# Patient Record
Sex: Female | Born: 1980 | Race: Black or African American | Hispanic: No | Marital: Single | Smoking: Never smoker
Health system: Southern US, Community
[De-identification: ages and names within clinical notes are randomized; demographics above are authoritative.]

## PROBLEM LIST (undated history)

## (undated) ENCOUNTER — Inpatient Hospital Stay (HOSPITAL_COMMUNITY): Payer: Self-pay

## (undated) DIAGNOSIS — D649 Anemia, unspecified: Secondary | ICD-10-CM

## (undated) DIAGNOSIS — D509 Iron deficiency anemia, unspecified: Secondary | ICD-10-CM

## (undated) DIAGNOSIS — D573 Sickle-cell trait: Secondary | ICD-10-CM

## (undated) DIAGNOSIS — F411 Generalized anxiety disorder: Secondary | ICD-10-CM

## (undated) DIAGNOSIS — F319 Bipolar disorder, unspecified: Secondary | ICD-10-CM

## (undated) DIAGNOSIS — Z789 Other specified health status: Secondary | ICD-10-CM

## (undated) DIAGNOSIS — F329 Major depressive disorder, single episode, unspecified: Secondary | ICD-10-CM

## (undated) DIAGNOSIS — Z862 Personal history of diseases of the blood and blood-forming organs and certain disorders involving the immune mechanism: Secondary | ICD-10-CM

## (undated) HISTORY — PX: INDUCED ABORTION: SHX677

## (undated) HISTORY — PX: OTHER SURGICAL HISTORY: SHX169

## (undated) HISTORY — PX: GANGLION CYST EXCISION: SHX1691

---

## 2004-09-09 ENCOUNTER — Ambulatory Visit (HOSPITAL_BASED_OUTPATIENT_CLINIC_OR_DEPARTMENT_OTHER): Admission: RE | Admit: 2004-09-09 | Discharge: 2004-09-09 | Payer: Self-pay | Admitting: Orthopedic Surgery

## 2004-09-09 ENCOUNTER — Encounter (INDEPENDENT_AMBULATORY_CARE_PROVIDER_SITE_OTHER): Payer: Self-pay | Admitting: *Deleted

## 2004-09-09 ENCOUNTER — Ambulatory Visit (HOSPITAL_COMMUNITY): Admission: RE | Admit: 2004-09-09 | Discharge: 2004-09-09 | Payer: Self-pay | Admitting: Orthopedic Surgery

## 2005-03-12 HISTORY — PX: GANGLION CYST EXCISION: SHX1691

## 2005-03-12 HISTORY — PX: WRIST GANGLION EXCISION: SUR520

## 2006-05-31 ENCOUNTER — Ambulatory Visit (HOSPITAL_BASED_OUTPATIENT_CLINIC_OR_DEPARTMENT_OTHER): Admission: RE | Admit: 2006-05-31 | Discharge: 2006-05-31 | Payer: Self-pay | Admitting: Orthopedic Surgery

## 2006-05-31 ENCOUNTER — Encounter (INDEPENDENT_AMBULATORY_CARE_PROVIDER_SITE_OTHER): Payer: Self-pay | Admitting: *Deleted

## 2006-05-31 HISTORY — PX: WRIST MASS EXCISION: SHX2674

## 2008-04-29 ENCOUNTER — Encounter: Admission: RE | Admit: 2008-04-29 | Discharge: 2008-04-29 | Payer: Self-pay | Admitting: Obstetrics and Gynecology

## 2010-11-11 NOTE — Op Note (Signed)
NAME:  [name], [name] NO.:  [id number]   MEDICAL RECORD NO.:  [id number]          PATIENT TYPE:  AMB   LOCATION:  DSC                          FACILITY:  [facility]   PHYSICIAN:  Katy Fitch. Sypher, M.D. DATE OF BIRTH:  September 17, 1980   DATE OF PROCEDURE:  05/31/2006  DATE OF DISCHARGE:                               OPERATIVE REPORT   PREOPERATIVE DIAGNOSIS:  Recurrent mass dorsal aspect left wrist  following resection of a myxomatous lesion in March 2006.   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   OPERATING SURGEON:  Katy Fitch. Sypher, M.D.   ASSISTANT:  Molly Maduro Dasnoit PA-C.   ANESTHESIA:  General by LMA.   SUPERVISING ANESTHESIOLOGIST:  Dr. Sampson Goon.   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   In March of 2006, she consulted with our practice and requested removal  of what was thought to be a ganglion cyst.   At the time of surgery, she was noted to have a diffuse mass  infiltrating through the dorsal capsule that was characterized as a  myxoma/ganglion.   In my view, it is challenging to discriminate between these two lesions  at times.   The time of her surgery we described to her the infiltrating nature of  her lesion and advised her that there was a significant risk of  recurrence.   She now presents for evaluation of a recurrent mass.  She states that  she was pain-free for more than 1 year.  However, during the past  several months she has noted a thickening on the dorsal aspect of her  wrist and pain with load-bearing and dorsiflexion.   Clinical examination in the office in November 2007 documented a  recurrent mass consistent with a myxoma.   We have advised  re-exploration and excision at this time.   PROCEDURE:  Chrissy Ealey was brought to the operating room and placed  in supine position upon the operating table.   Following the induction of general anesthesia by LMA technique, the left  arm was prepped with Betadine soap and solution and sterilely draped.  A  pneumatic tourniquet was applied to the proximal brachium.  We  administered 1 g of Ancef as an IV prophylactic antibiotic.   The procedure commenced with a careful resection of the previous  surgical scar.  Subcutaneous tissues were meticulously dissected,  sparing the longitudinal veins.  The extensor retinaculum was bulging.  This was split in the line of its fibers and retracted proximally and  distally with Jomarie Longs skin hooks.  A very poorly defined mucinous mass  was identified.  This was bubbly in appearance and extended over a foot  print of about 3 x 3 cm.   This was dissected and curiously had two layers.  There was a  superficial layer of multiloculated cystic material that involved the  entire dorsal capsule.  This was circumferentially dissected and removed  en block.  Beneath this was a second cyst that was blood stained and  measured approximately 1.2 cm diameter.  This was emanating directly up  the scapholunate interosseous ligament.   Care was taken to essentially radically excise all the abnormal-  appearing tissue leaving the proximal pole of the scaphoid exposed and  the scapholunate ligament exposed.   There was noted to be a 8 x 6 mm area of full thickness hyaline  cartilage loss on the proximal pole of the scaphoid which may have been  a pressure phenomenon from the mass.  There was also a small area of  loss of hyaline cartilage on the dorsum of the lunate measuring  approximately 4 mm from proximal to distal and about 6 mm in width.   These areas were thoroughly cleaned and all mucinous material resected.   After hemostasis was achieved, the wound  was irrigated followed by  repair of the skin with subdermal sutures of 4-0 Vicryl and intradermal  3-0 Prolene with Steri-Strips.   A compressive dressing was applied with a gauze bump directly over the  wound to prevent fluid collecting in the dead space.  A compressive  dressing was applied with volar plaster splint maintaining the wrist in  5 degrees dorsiflexion.  There were no apparent complications.   For aftercare, Ms. Shevlin is provided prescription for Percocet 5 mg one  p.o. q.4-6h. p.r.n. pain 20 tablets without refill.  Also, she is  provided Keflex 500 mg one p.o. q.8h. x4 days as prophylactic  antibiotic.      Katy Fitch Sypher, M.D.  Electronically Signed     RVS/MEDQ  D:  05/31/2006  T:  05/31/2006  Job:  [id number]

## 2010-11-11 NOTE — Op Note (Signed)
NAME:  Melody Wells, Melody Wells NO.:  [id number]   MEDICAL RECORD NO.:  [id number]          PATIENT TYPE:  AMB   LOCATION:  DSC                          FACILITY:  [facility]   PHYSICIAN:  Katy Fitch. Sypher Montez Hageman., M.D.DATE OF BIRTH:  09-May-1981   DATE OF PROCEDURE:  09/09/2004  DATE OF DISCHARGE:                                 OPERATIVE REPORT   PREOPERATIVE DIAGNOSIS:  Enlarging mass, dorsal aspect of left wrist,  consistent with large dorsal capsular ganglion.   POSTOPERATIVE DIAGNOSIS:  Enlarging mass, dorsal aspect of left wrist,  consistent with large dorsal capsular ganglion with identification of  complex extra-articular ganglion and multiple budding intra-articular  ganglion cysts directly over scapholunate interosseous ligament within wrist  capsule.   OPERATION:  Aggressive debridement of ganglion cyst/myxoma, dorsal aspect of  left wrist, with repair of dorsal wrist capsule.   OPERATING SURGEON:  Katy Fitch. Sypher, M.D.   ASSISTANT:  Jonni Sanger, P.A.   ANESTHESIA:  General by LMA   SUPERVISING ANESTHESIOLOGIST:  Janetta Hora. Gelene Mink, M.D.   INDICATIONS:  Melody Wells is a 30 year old right-hand dominant woman  referred by Dr. Cherlyn Roberts for evaluation and management of an  enlarging right dorsal wrist mass.  She had had a ganglion for many years  which was now unsightly, quite large and uncomfortable.  She requested  excision.   Preoperatively, she was clearly advised that the ganglion phenomenon this  poorly understood.   We reviewed the art of removing ganglions and explained to her that there  was a significant chance of recurrence of the next 10 years.   We will do a best to debride the mucoid collection as well as the abnormal  tissues near the scapholunate ligament that typically are related to the  formation of the lesions.   Preoperatively, questions were invited and answered.  After informed consent  with Dr. Gelene Mink regarding  anesthesia she has selected general anesthesia  by LMA technique.   PROCEDURE:  Jazel Nimmons was brought to the operating room and placed in a  supine position upon the operating table.   Following the induction of general anesthesia by LMA technique, the left arm  was prepped with Betadine soap and solution and sterilely draped.   Following exsanguination of the left arm an Esmarch bandage, an arterial  tourniquet on the proximal brachium was inflated to 220 mmHg.   One gram of Ancef was administered as an IV prophylactic antibiotic.   Procedure commenced with a 3-cm transverse incision directly over the dorsal  aspect of the cyst.  Subcutaneous tissues were carefully divided, taking  care to identify the extensors in the second and fourth dorsal compartments.  The cyst was more than 3 x 3 cm and approximately 2.5 cm in height.  The  cyst wall was carefully dissected free from the capsular structures and care  was taken to preserve the dorsal carpal arch.   The cyst was drained of its contents and followed to the capsule directly  over the scapholunate ligament.   All portion of the extra-articular cyst was removed.  Subcutaneous region  was thoroughly irrigated.  The joint was palpated and a clear mass within  the dorsal capsule of the wrist was noted.  An arthrotomy was performed,  exposing the midcarpal joint, scapholunate interosseous ligament, scaphoid  and lunate.  There were too small ganglion measuring approximately 5 x 6 mm  on the dorsoulnar aspect of the scapholunate interosseous ligament.   These were debrided with a rongeur.  Care was taken to remove all pathologic-  appearing tissues on the dorsal aspect the scapholunate ligament without  disrupting the fibers of the ligament.   There was scalloping on the dorsal aspect of the proximal scaphoid and  dorsal lunate.  This cyst appeared to be present for quite awhile.   The wrist and dorsal capsule region were  thoroughly irrigated with sterile  saline to remove all mucinous material with a syringe for high-pressure  irrigation.   The capsule was then repaired with a running suture of 4-0 Vicryl to create  a watertight seal.   The wound was then repaired with intradermal 3-0 Prolene.   A compressive dressing was applied with a volar plaster splint  to maintain  the wrist in 5 degrees of dorsiflexion.   There were no apparent complications.      RVS/MEDQ  D:  09/09/2004  T:  09/09/2004  Job:  [id number]

## 2012-09-02 ENCOUNTER — Other Ambulatory Visit: Payer: Self-pay | Admitting: Family Medicine

## 2012-09-02 ENCOUNTER — Other Ambulatory Visit (HOSPITAL_COMMUNITY)
Admission: RE | Admit: 2012-09-02 | Discharge: 2012-09-02 | Disposition: A | Discharge status: Home / Self Care | Payer: BC Managed Care – PPO | Source: Ambulatory Visit | Attending: Family Medicine | Admitting: Family Medicine

## 2012-09-02 DIAGNOSIS — Z113 Encounter for screening for infections with a predominantly sexual mode of transmission: Secondary | ICD-10-CM | POA: Insufficient documentation

## 2012-09-02 DIAGNOSIS — Z01419 Encounter for gynecological examination (general) (routine) without abnormal findings: Secondary | ICD-10-CM | POA: Insufficient documentation

## 2013-10-29 LAB — OB RESULTS CONSOLE ABO/RH: RH Type: POSITIVE

## 2013-10-29 LAB — OB RESULTS CONSOLE HEPATITIS B SURFACE ANTIGEN: Hepatitis B Surface Ag: NEGATIVE

## 2013-10-29 LAB — OB RESULTS CONSOLE HIV ANTIBODY (ROUTINE TESTING): HIV: NONREACTIVE

## 2013-10-29 LAB — OB RESULTS CONSOLE ANTIBODY SCREEN: ANTIBODY SCREEN: NEGATIVE

## 2013-10-29 LAB — OB RESULTS CONSOLE RPR: RPR: NONREACTIVE

## 2013-10-29 LAB — OB RESULTS CONSOLE RUBELLA ANTIBODY, IGM: RUBELLA: IMMUNE

## 2013-11-20 LAB — OB RESULTS CONSOLE GC/CHLAMYDIA
Chlamydia: NEGATIVE
GC PROBE AMP, GENITAL: NEGATIVE

## 2013-11-28 ENCOUNTER — Emergency Department (HOSPITAL_COMMUNITY)
Admission: EM | Admit: 2013-11-28 | Discharge: 2013-11-28 | Disposition: A | Discharge status: Home / Self Care | Payer: BC Managed Care – PPO | Attending: Emergency Medicine | Admitting: Emergency Medicine

## 2013-11-28 ENCOUNTER — Other Ambulatory Visit: Payer: Self-pay

## 2013-11-28 ENCOUNTER — Encounter (HOSPITAL_COMMUNITY): Payer: Self-pay | Admitting: Emergency Medicine

## 2013-11-28 DIAGNOSIS — M549 Dorsalgia, unspecified: Secondary | ICD-10-CM

## 2013-11-28 DIAGNOSIS — Z792 Long term (current) use of antibiotics: Secondary | ICD-10-CM | POA: Insufficient documentation

## 2013-11-28 DIAGNOSIS — Z79899 Other long term (current) drug therapy: Secondary | ICD-10-CM | POA: Insufficient documentation

## 2013-11-28 DIAGNOSIS — Z3490 Encounter for supervision of normal pregnancy, unspecified, unspecified trimester: Secondary | ICD-10-CM

## 2013-11-28 DIAGNOSIS — O9989 Other specified diseases and conditions complicating pregnancy, childbirth and the puerperium: Secondary | ICD-10-CM | POA: Insufficient documentation

## 2013-11-28 DIAGNOSIS — Y929 Unspecified place or not applicable: Secondary | ICD-10-CM | POA: Insufficient documentation

## 2013-11-28 DIAGNOSIS — IMO0002 Reserved for concepts with insufficient information to code with codable children: Secondary | ICD-10-CM | POA: Insufficient documentation

## 2013-11-28 DIAGNOSIS — Y9389 Activity, other specified: Secondary | ICD-10-CM | POA: Insufficient documentation

## 2013-11-28 DIAGNOSIS — R110 Nausea: Secondary | ICD-10-CM | POA: Insufficient documentation

## 2013-11-28 LAB — URINALYSIS, ROUTINE W REFLEX MICROSCOPIC
Bilirubin Urine: NEGATIVE
GLUCOSE, UA: NEGATIVE mg/dL
Hgb urine dipstick: NEGATIVE
Ketones, ur: NEGATIVE mg/dL
Leukocytes, UA: NEGATIVE
NITRITE: NEGATIVE
PH: 6.5 (ref 5.0–8.0)
Protein, ur: NEGATIVE mg/dL
Specific Gravity, Urine: 1.018 (ref 1.005–1.030)
UROBILINOGEN UA: 0.2 mg/dL (ref 0.0–1.0)

## 2013-11-28 MED ORDER — ACETAMINOPHEN 325 MG PO TABS: 650.0000 mg | ORAL_TABLET | Freq: Once | ORAL | Status: DC
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
## 2014-04-15 ENCOUNTER — Inpatient Hospital Stay (HOSPITAL_COMMUNITY): Payer: BC Managed Care – PPO

## 2014-04-15 ENCOUNTER — Encounter (HOSPITAL_COMMUNITY): Payer: Self-pay | Admitting: *Deleted

## 2014-04-15 ENCOUNTER — Inpatient Hospital Stay (HOSPITAL_COMMUNITY)
Admission: AD | Admit: 2014-04-15 | Discharge: 2014-04-15 | Disposition: A | Discharge status: Home / Self Care | Payer: BC Managed Care – PPO | Source: Ambulatory Visit | Attending: Obstetrics and Gynecology | Admitting: Obstetrics and Gynecology

## 2014-04-15 DIAGNOSIS — Z3A32 32 weeks gestation of pregnancy: Secondary | ICD-10-CM

## 2014-04-15 DIAGNOSIS — O368130 Decreased fetal movements, third trimester, not applicable or unspecified: Secondary | ICD-10-CM | POA: Diagnosis not present

## 2014-04-15 DIAGNOSIS — O368134 Decreased fetal movements, third trimester, fetus 4: Secondary | ICD-10-CM

## 2014-04-15 HISTORY — DX: Other specified health status: Z78.9

## 2014-04-15 HISTORY — DX: Anemia, unspecified: D64.9

## 2014-04-15 HISTORY — DX: Sickle-cell trait: D57.3

## 2014-04-15 NOTE — MAU Note (Signed)
Audible movement noted while in with pt, pt agrees- baby is moving

## 2014-04-15 NOTE — Discharge Instructions (Signed)
Keep appointment at office for Monday.  Fetal Movement Counts Patient Name: __________________________________________________ Patient Due Date: ____________________ Performing a fetal movement count is highly recommended in high-risk pregnancies, but it is good for every pregnant woman to do. Your health care provider may ask you to start counting fetal movements at 28 weeks of the pregnancy. Fetal movements often increase:  After eating a full meal.  After physical activity.  After eating or drinking something sweet or cold.  At rest. Pay attention to when you feel the baby is most active. This will help you notice a pattern of your baby's sleep and wake cycles and what factors contribute to an increase in fetal movement. It is important to perform a fetal movement count at the same time each day when your baby is normally most active.  HOW TO COUNT FETAL MOVEMENTS 1. Find a quiet and comfortable area to sit or lie down on your left side. Lying on your left side provides the best blood and oxygen circulation to your baby. 2. Write down the day and time on a sheet of paper or in a journal. 3. Start counting kicks, flutters, swishes, rolls, or jabs in a 2-hour period. You should feel at least 10 movements within 2 hours. 4. If you do not feel 10 movements in 2 hours, wait 2-3 hours and count again. Look for a change in the pattern or not enough counts in 2 hours. SEEK MEDICAL CARE IF:  You feel less than 10 counts in 2 hours, tried twice.  There is no movement in over an hour.  The pattern is changing or taking longer each day to reach 10 counts in 2 hours.  You feel the baby is not moving as he or she usually does. Date: ____________ Movements: ____________ Start time: ____________ Doreatha Martin time: ____________  Date: ____________ Movements: ____________ Start time: ____________ Doreatha Martin time: ____________ Date: ____________ Movements: ____________ Start time: ____________ Doreatha Martin time:  ____________ Date: ____________ Movements: ____________ Start time: ____________ Doreatha Martin time: ____________ Date: ____________ Movements: ____________ Start time: ____________ Doreatha Martin time: ____________ Date: ____________ Movements: ____________ Start time: ____________ Doreatha Martin time: ____________ Date: ____________ Movements: ____________ Start time: ____________ Doreatha Martin time: ____________ Date: ____________ Movements: ____________ Start time: ____________ Doreatha Martin time: ____________  Date: ____________ Movements: ____________ Start time: ____________ Doreatha Martin time: ____________ Date: ____________ Movements: ____________ Start time: ____________ Doreatha Martin time: ____________ Date: ____________ Movements: ____________ Start time: ____________ Doreatha Martin time: ____________ Date: ____________ Movements: ____________ Start time: ____________ Doreatha Martin time: ____________ Date: ____________ Movements: ____________ Start time: ____________ Doreatha Martin time: ____________ Date: ____________ Movements: ____________ Start time: ____________ Doreatha Martin time: ____________ Date: ____________ Movements: ____________ Start time: ____________ Doreatha Martin time: ____________  Date: ____________ Movements: ____________ Start time: ____________ Doreatha Martin time: ____________ Date: ____________ Movements: ____________ Start time: ____________ Doreatha Martin time: ____________ Date: ____________ Movements: ____________ Start time: ____________ Doreatha Martin time: ____________ Date: ____________ Movements: ____________ Start time: ____________ Doreatha Martin time: ____________ Date: ____________ Movements: ____________ Start time: ____________ Doreatha Martin time: ____________ Date: ____________ Movements: ____________ Start time: ____________ Doreatha Martin time: ____________ Date: ____________ Movements: ____________ Start time: ____________ Doreatha Martin time: ____________  Date: ____________ Movements: ____________ Start time: ____________ Doreatha Martin time: ____________ Date: ____________ Movements:  ____________ Start time: ____________ Doreatha Martin time: ____________ Date: ____________ Movements: ____________ Start time: ____________ Doreatha Martin time: ____________ Date: ____________ Movements: ____________ Start time: ____________ Doreatha Martin time: ____________ Date: ____________ Movements: ____________ Start time: ____________ Doreatha Martin time: ____________ Date: ____________ Movements: ____________ Start time: ____________ Doreatha Martin time: ____________ Date: ____________ Movements: ____________ Start time: ____________ Doreatha Martin time: ____________  Date:  ____________ Movements: ____________ Start time: ____________ Doreatha MartinFinish time: ____________ Date: ____________ Movements: ____________ Start time: ____________ Doreatha MartinFinish time: ____________ Date: ____________ Movements: ____________ Start time: ____________ Doreatha MartinFinish time: ____________ Date: ____________ Movements: ____________ Start time: ____________ Doreatha MartinFinish time: ____________ Date: ____________ Movements: ____________ Start time: ____________ Doreatha MartinFinish time: ____________ Date: ____________ Movements: ____________ Start time: ____________ Doreatha MartinFinish time: ____________ Date: ____________ Movements: ____________ Start time: ____________ Doreatha MartinFinish time: ____________  Date: ____________ Movements: ____________ Start time: ____________ Doreatha MartinFinish time: ____________ Date: ____________ Movements: ____________ Start time: ____________ Doreatha MartinFinish time: ____________ Date: ____________ Movements: ____________ Start time: ____________ Doreatha MartinFinish time: ____________ Date: ____________ Movements: ____________ Start time: ____________ Doreatha MartinFinish time: ____________ Date: ____________ Movements: ____________ Start time: ____________ Doreatha MartinFinish time: ____________ Date: ____________ Movements: ____________ Start time: ____________ Doreatha MartinFinish time: ____________ Date: ____________ Movements: ____________ Start time: ____________ Doreatha MartinFinish time: ____________  Date: ____________ Movements: ____________ Start time: ____________ Doreatha MartinFinish  time: ____________ Date: ____________ Movements: ____________ Start time: ____________ Doreatha MartinFinish time: ____________ Date: ____________ Movements: ____________ Start time: ____________ Doreatha MartinFinish time: ____________ Date: ____________ Movements: ____________ Start time: ____________ Doreatha MartinFinish time: ____________ Date: ____________ Movements: ____________ Start time: ____________ Doreatha MartinFinish time: ____________ Date: ____________ Movements: ____________ Start time: ____________ Doreatha MartinFinish time: ____________ Date: ____________ Movements: ____________ Start time: ____________ Doreatha MartinFinish time: ____________  Date: ____________ Movements: ____________ Start time: ____________ Doreatha MartinFinish time: ____________ Date: ____________ Movements: ____________ Start time: ____________ Doreatha MartinFinish time: ____________ Date: ____________ Movements: ____________ Start time: ____________ Doreatha MartinFinish time: ____________ Date: ____________ Movements: ____________ Start time: ____________ Doreatha MartinFinish time: ____________ Date: ____________ Movements: ____________ Start time: ____________ Doreatha MartinFinish time: ____________ Date: ____________ Movements: ____________ Start time: ____________ Doreatha MartinFinish time: ____________ Document Released: 07/12/2006 Document Revised: 10/27/2013 Document Reviewed: 04/08/2012 ExitCare Patient Information 2015 [employer]. This information is not intended to replace advice given to you by your health care provider. Make sure you discuss any questions you have with your health care provider. Braxton Hicks Contractions Contractions of the uterus can occur throughout pregnancy. Contractions are not always a sign that you are in labor.  WHAT ARE BRAXTON HICKS CONTRACTIONS?  Contractions that occur before labor are called Braxton Hicks contractions, or false labor. Toward the end of pregnancy (32-34 weeks), these contractions can develop more often and may become more forceful. This is not true labor because these contractions do not result in opening  (dilatation) and thinning of the cervix. They are sometimes difficult to tell apart from true labor because these contractions can be forceful and people have different pain tolerances. You should not feel embarrassed if you go to the hospital with false labor. Sometimes, the only way to tell if you are in true labor is for your health care provider to look for changes in the cervix. If there are no prenatal problems or other health problems associated with the pregnancy, it is completely safe to be sent home with false labor and await the onset of true labor. HOW CAN YOU TELL THE DIFFERENCE BETWEEN TRUE AND FALSE LABOR? False Labor  The contractions of false labor are usually shorter and not as hard as those of true labor.   The contractions are usually irregular.   The contractions are often felt in the front of the lower abdomen and in the groin.   The contractions may go away when you walk around or change positions while lying down.   The contractions get weaker and are shorter lasting as time goes on.   The contractions do not usually become progressively stronger, regular, and closer together as with true labor.  True Labor  Contractions  in true labor last 30-70 seconds, become very regular, usually become more intense, and increase in frequency.   The contractions do not go away with walking.   The discomfort is usually felt in the top of the uterus and spreads to the lower abdomen and low back.   True labor can be determined by your health care provider with an exam. This will show that the cervix is dilating and getting thinner.  WHAT TO REMEMBER  Keep up with your usual exercises and follow other instructions given by your health care provider.   Take medicines as directed by your health care provider.   Keep your regular prenatal appointments.   Eat and drink lightly if you think you are going into labor.   If Braxton Hicks contractions are making you  uncomfortable:   Change your position from lying down or resting to walking, or from walking to resting.   Sit and rest in a tub of warm water.   Drink 2-3 glasses of water. Dehydration may cause these contractions.   Do slow and deep breathing several times an hour.  WHEN SHOULD I SEEK IMMEDIATE MEDICAL CARE? Seek immediate medical care if:  Your contractions become stronger, more regular, and closer together.   You have fluid leaking or gushing from your vagina.   You have a fever.   You pass blood-tinged mucus.   You have vaginal bleeding.   You have continuous abdominal pain.   You have low back pain that you never had before.   You feel your baby's head pushing down and causing pelvic pressure.   Your baby is not moving as much as it used to.  Document Released: 06/12/2005 Document Revised: 06/17/2013 Document Reviewed: 03/24/2013 [facility]Exit[facility] Patient Information 2015 [place], [place]. This information is not intended to replace advice given to you by your health care provider. Make sure you discuss any questions you have with your health care provider.

## 2014-04-15 NOTE — MAU Provider Note (Signed)
History   NR NST ofice  Chief Complaint  Patient presents with  . Decreased Fetal Movement   33 yo G2P0010 BF @ 32 6/[redacted] weeks gestation presented to office with complaint of decreased FM. Last ate at noon NST done there  Was reassuring but non reactive. Pt sent for further evaluation   OB History   Grav Para Term Preterm Abortions TAB SAB Ect Mult Living   2    1 1  0         Past Medical History  Diagnosis Date  . Medical history non-contributory   . Anemia   . Sickle cell trait     Past Surgical History  Procedure Laterality Date  . Cyst removal on left wrist    . Induced abortion    . Ganglion cyst excision      x2, left wrist    Family History  Problem Relation Age of Onset  . Hearing loss Neg Hx     History  Substance Use Topics  . Smoking status: Never Smoker   . Smokeless tobacco: Never Used  . Alcohol Use: No    Allergies: No Known Allergies  Prescriptions prior to admission  Medication Sig Dispense Refill  . metroNIDAZOLE (FLAGYL) 500 MG tablet Take 500 mg by mouth 2 (two) times daily. Started medication today take for 7 days until completed      . Prenatal Vit-Fe Fumarate-FA (PRENATAL MULTIVITAMIN) TABS tablet Take 1 tablet by mouth daily at 12 noon.         Physical Exam   Blood pressure 132/75, pulse 90, temperature 97.6 F (36.4 C), temperature source Oral, resp. rate 18, height 5\' 4"  (1.626 m), weight 95.709 kg (211 lb).  No exam performed today, just done in office. ED Course   Decreased FM in  Third trimester IUP @ 32 6 /7 weeks P) NST. BPP MDM   Herbert Marken A, MD 7:30 PM 04/15/2014

## 2014-04-15 NOTE — MAU Note (Signed)
Decreased fetal movement last couple days. No bleeding, leaking or pain.  Failed NST at office.  Sent over for further eval

## 2014-04-27 ENCOUNTER — Encounter (HOSPITAL_COMMUNITY): Payer: Self-pay | Admitting: *Deleted

## 2014-05-04 LAB — OB RESULTS CONSOLE GBS: GBS: POSITIVE

## 2014-05-23 ENCOUNTER — Inpatient Hospital Stay (HOSPITAL_COMMUNITY): Payer: BC Managed Care – PPO | Admitting: Anesthesiology

## 2014-05-23 ENCOUNTER — Encounter (HOSPITAL_COMMUNITY): Payer: Self-pay | Admitting: *Deleted

## 2014-05-23 ENCOUNTER — Inpatient Hospital Stay (HOSPITAL_COMMUNITY)
Admission: AD | Admit: 2014-05-23 | Discharge: 2014-05-26 | DRG: 765 | Disposition: A | Discharge status: Home / Self Care | Payer: BC Managed Care – PPO | Source: Ambulatory Visit | Attending: Obstetrics & Gynecology | Admitting: Obstetrics & Gynecology

## 2014-05-23 ENCOUNTER — Encounter (HOSPITAL_COMMUNITY)
Admission: AD | Disposition: A | Discharge status: Home / Self Care | Payer: Self-pay | Source: Ambulatory Visit | Attending: Obstetrics & Gynecology

## 2014-05-23 DIAGNOSIS — O4403 Placenta previa specified as without hemorrhage, third trimester: Secondary | ICD-10-CM | POA: Diagnosis present

## 2014-05-23 DIAGNOSIS — IMO0001 Reserved for inherently not codable concepts without codable children: Secondary | ICD-10-CM

## 2014-05-23 DIAGNOSIS — Z3483 Encounter for supervision of other normal pregnancy, third trimester: Secondary | ICD-10-CM | POA: Diagnosis present

## 2014-05-23 DIAGNOSIS — D573 Sickle-cell trait: Secondary | ICD-10-CM | POA: Diagnosis present

## 2014-05-23 DIAGNOSIS — Z3A38 38 weeks gestation of pregnancy: Secondary | ICD-10-CM | POA: Diagnosis present

## 2014-05-23 DIAGNOSIS — O99824 Streptococcus B carrier state complicating childbirth: Secondary | ICD-10-CM | POA: Diagnosis present

## 2014-05-23 DIAGNOSIS — Z9889 Other specified postprocedural states: Secondary | ICD-10-CM

## 2014-05-23 DIAGNOSIS — O9902 Anemia complicating childbirth: Secondary | ICD-10-CM | POA: Diagnosis present

## 2014-05-23 LAB — CBC
HCT: 38.2 % (ref 36.0–46.0)
Hemoglobin: 13.5 g/dL (ref 12.0–15.0)
MCH: 28.2 pg (ref 26.0–34.0)
MCHC: 35.3 g/dL (ref 30.0–36.0)
MCV: 79.9 fL (ref 78.0–100.0)
PLATELETS: 204 10*3/uL (ref 150–400)
RBC: 4.78 MIL/uL (ref 3.87–5.11)
RDW: 13.9 % (ref 11.5–15.5)
WBC: 10.4 10*3/uL (ref 4.0–10.5)

## 2014-05-23 LAB — RAPID HIV SCREEN (WH-MAU): Rapid HIV Screen: NONREACTIVE

## 2014-05-23 LAB — RPR

## 2014-05-23 SURGERY — Surgical Case
Anesthesia: Epidural | Site: Abdomen

## 2014-05-23 MED ORDER — ONDANSETRON HCL 4 MG PO TABS: 4.0000 mg | ORAL_TABLET | ORAL | Status: DC | PRN

## 2014-05-23 MED ORDER — OXYCODONE-ACETAMINOPHEN 5-325 MG PO TABS: 2.0000 | ORAL_TABLET | ORAL | Status: DC | PRN

## 2014-05-23 MED ORDER — ONDANSETRON HCL 4 MG/2ML IJ SOLN: 4.0000 mg | INTRAMUSCULAR | Status: DC | PRN

## 2014-05-23 MED ORDER — MEPERIDINE HCL 25 MG/ML IJ SOLN: 6.2500 mg | INTRAMUSCULAR | Status: DC | PRN

## 2014-05-23 MED ORDER — OXYTOCIN 40 UNITS IN LACTATED RINGERS INFUSION - SIMPLE MED: 62.5000 mL/h | INTRAVENOUS | Status: DC

## 2014-05-23 MED ORDER — NALBUPHINE HCL 10 MG/ML IJ SOLN: 5.0000 mg | Freq: Once | INTRAMUSCULAR | Status: AC | PRN

## 2014-05-23 MED ORDER — SODIUM CHLORIDE 0.9 % IJ SOLN: 3.0000 mL | INTRAMUSCULAR | Status: DC | PRN

## 2014-05-23 MED ORDER — CITRIC ACID-SODIUM CITRATE 334-500 MG/5ML PO SOLN
30.0000 mL | ORAL | Status: DC | PRN
Start: 2014-05-23 — End: 2014-05-23
  Administered 2014-05-23: 30 mL via ORAL
  Filled 2014-05-23: qty 15

## 2014-05-23 MED ORDER — OXYCODONE-ACETAMINOPHEN 5-325 MG PO TABS: 1.0000 | ORAL_TABLET | ORAL | Status: DC | PRN

## 2014-05-23 MED ORDER — FENTANYL CITRATE 0.05 MG/ML IJ SOLN
INTRAMUSCULAR | Status: AC
  Filled 2014-05-23: qty 2

## 2014-05-23 MED ORDER — FENTANYL 2.5 MCG/ML BUPIVACAINE 1/10 % EPIDURAL INFUSION (WH - ANES)
INTRAMUSCULAR | Status: AC
  Administered 2014-05-23: 10 mL/h via EPIDURAL
  Filled 2014-05-23: qty 125

## 2014-05-23 MED ORDER — INFLUENZA VAC SPLIT QUAD 0.5 ML IM SUSY: 0.5000 mL | PREFILLED_SYRINGE | INTRAMUSCULAR | Status: DC

## 2014-05-23 MED ORDER — PHENYLEPHRINE 40 MCG/ML (10ML) SYRINGE FOR IV PUSH (FOR BLOOD PRESSURE SUPPORT)
PREFILLED_SYRINGE | INTRAVENOUS | Status: DC
Start: 2014-05-23 — End: 2014-05-23
  Filled 2014-05-23: qty 10

## 2014-05-23 MED ORDER — ONDANSETRON HCL 4 MG/2ML IJ SOLN
INTRAMUSCULAR | Status: AC
  Filled 2014-05-23: qty 2

## 2014-05-23 MED ORDER — EPHEDRINE 5 MG/ML INJ
INTRAVENOUS | Status: AC
  Filled 2014-05-23: qty 4

## 2014-05-23 MED ORDER — PENICILLIN G POTASSIUM 5000000 UNITS IJ SOLR
2.5000 10*6.[IU] | INTRAVENOUS | Status: DC
  Filled 2014-05-23 (×3): qty 2.5

## 2014-05-23 MED ORDER — CEFAZOLIN SODIUM-DEXTROSE 2-3 GM-% IV SOLR
INTRAVENOUS | Status: AC
  Filled 2014-05-23: qty 50

## 2014-05-23 MED ORDER — OXYTOCIN BOLUS FROM INFUSION
500.0000 mL | INTRAVENOUS | Status: DC
Start: 2014-05-23 — End: 2014-05-23

## 2014-05-23 MED ORDER — LACTATED RINGERS IV SOLN
INTRAVENOUS | Status: DC | PRN
  Administered 2014-05-23: 06:00:00 via INTRAVENOUS

## 2014-05-23 MED ORDER — DIPHENHYDRAMINE HCL 50 MG/ML IJ SOLN
12.5000 mg | INTRAMUSCULAR | Status: DC | PRN
  Administered 2014-05-23: 12.5 mg via INTRAVENOUS
  Filled 2014-05-23: qty 1

## 2014-05-23 MED ORDER — BUPIVACAINE HCL (PF) 0.25 % IJ SOLN
INTRAMUSCULAR | Status: AC
  Filled 2014-05-23: qty 10

## 2014-05-23 MED ORDER — FENTANYL CITRATE 0.05 MG/ML IJ SOLN
INTRAMUSCULAR | Status: DC | PRN
  Administered 2014-05-23: 100 ug via INTRAVENOUS

## 2014-05-23 MED ORDER — LIDOCAINE HCL (PF) 1 % IJ SOLN
INTRAMUSCULAR | Status: DC | PRN
  Administered 2014-05-23 (×2): 4 mL

## 2014-05-23 MED ORDER — SODIUM BICARBONATE 8.4 % IV SOLN
INTRAVENOUS | Status: DC | PRN
  Administered 2014-05-23: 10 mL via EPIDURAL
  Administered 2014-05-23: 5 mL via EPIDURAL

## 2014-05-23 MED ORDER — MORPHINE SULFATE 0.5 MG/ML IJ SOLN
INTRAMUSCULAR | Status: AC
  Filled 2014-05-23: qty 10

## 2014-05-23 MED ORDER — KETOROLAC TROMETHAMINE 30 MG/ML IJ SOLN
30.0000 mg | Freq: Four times a day (QID) | INTRAMUSCULAR | Status: DC | PRN
  Administered 2014-05-23: 30 mg via INTRAMUSCULAR

## 2014-05-23 MED ORDER — FLEET ENEMA 7-19 GM/118ML RE ENEM: 1.0000 | ENEMA | RECTAL | Status: DC | PRN

## 2014-05-23 MED ORDER — LACTATED RINGERS IV SOLN
500.0000 mL | INTRAVENOUS | Status: DC | PRN
  Administered 2014-05-23: 1000 mL via INTRAVENOUS

## 2014-05-23 MED ORDER — FERROUS SULFATE 325 (65 FE) MG PO TABS
325.0000 mg | ORAL_TABLET | Freq: Two times a day (BID) | ORAL | Status: DC
  Administered 2014-05-24 – 2014-05-26 (×5): 325 mg via ORAL
  Filled 2014-05-23 (×5): qty 1

## 2014-05-23 MED ORDER — PENICILLIN G POTASSIUM 5000000 UNITS IJ SOLR
5.0000 10*6.[IU] | Freq: Once | INTRAVENOUS | Status: DC
  Filled 2014-05-23: qty 5

## 2014-05-23 MED ORDER — DIPHENHYDRAMINE HCL 25 MG PO CAPS: 25.0000 mg | ORAL_CAPSULE | ORAL | Status: DC | PRN

## 2014-05-23 MED ORDER — SIMETHICONE 80 MG PO CHEW: 80.0000 mg | CHEWABLE_TABLET | ORAL | Status: DC | PRN

## 2014-05-23 MED ORDER — IBUPROFEN 600 MG PO TABS
600.0000 mg | ORAL_TABLET | Freq: Four times a day (QID) | ORAL | Status: DC
  Administered 2014-05-23 – 2014-05-26 (×11): 600 mg via ORAL
  Filled 2014-05-23 (×11): qty 1

## 2014-05-23 MED ORDER — FENTANYL 2.5 MCG/ML BUPIVACAINE 1/10 % EPIDURAL INFUSION (WH - ANES)
10.0000 mL/h | INTRAMUSCULAR | Status: DC | PRN
  Administered 2014-05-23: 10 mL/h via EPIDURAL

## 2014-05-23 MED ORDER — OXYCODONE-ACETAMINOPHEN 5-325 MG PO TABS
2.0000 | ORAL_TABLET | ORAL | Status: DC | PRN
  Administered 2014-05-25 (×3): 2 via ORAL
  Filled 2014-05-23 (×3): qty 2

## 2014-05-23 MED ORDER — OXYTOCIN 40 UNITS IN LACTATED RINGERS INFUSION - SIMPLE MED
62.5000 mL/h | INTRAVENOUS | Status: DC
  Filled 2014-05-23: qty 1000

## 2014-05-23 MED ORDER — OXYTOCIN 10 UNIT/ML IJ SOLN
40.0000 [IU] | INTRAVENOUS | Status: DC | PRN
  Administered 2014-05-23: 40 [IU] via INTRAVENOUS

## 2014-05-23 MED ORDER — NALBUPHINE HCL 10 MG/ML IJ SOLN: 5.0000 mg | INTRAMUSCULAR | Status: DC | PRN

## 2014-05-23 MED ORDER — LACTATED RINGERS IV SOLN
500.0000 mL | Freq: Once | INTRAVENOUS | Status: AC
  Administered 2014-05-23: 1000 mL via INTRAVENOUS

## 2014-05-23 MED ORDER — TETANUS-DIPHTH-ACELL PERTUSSIS 5-2.5-18.5 LF-MCG/0.5 IM SUSP: 0.5000 mL | Freq: Once | INTRAMUSCULAR | Status: DC

## 2014-05-23 MED ORDER — ONDANSETRON HCL 4 MG/2ML IJ SOLN
INTRAMUSCULAR | Status: DC | PRN
  Administered 2014-05-23: 4 mg via INTRAVENOUS

## 2014-05-23 MED ORDER — PHENYLEPHRINE 40 MCG/ML (10ML) SYRINGE FOR IV PUSH (FOR BLOOD PRESSURE SUPPORT)
80.0000 ug | PREFILLED_SYRINGE | INTRAVENOUS | Status: DC | PRN
Start: 2014-05-23 — End: 2014-05-23

## 2014-05-23 MED ORDER — SENNOSIDES-DOCUSATE SODIUM 8.6-50 MG PO TABS
2.0000 | ORAL_TABLET | ORAL | Status: DC
  Administered 2014-05-23 – 2014-05-25 (×3): 2 via ORAL
  Filled 2014-05-23 (×3): qty 2

## 2014-05-23 MED ORDER — NALOXONE HCL 0.4 MG/ML IJ SOLN: 0.4000 mg | INTRAMUSCULAR | Status: DC | PRN

## 2014-05-23 MED ORDER — SODIUM CHLORIDE 0.9 % IV SOLN
2.0000 g | Freq: Once | INTRAVENOUS | Status: AC
  Administered 2014-05-23: 2 g via INTRAVENOUS
  Filled 2014-05-23: qty 2000

## 2014-05-23 MED ORDER — ZOLPIDEM TARTRATE 5 MG PO TABS: 5.0000 mg | ORAL_TABLET | Freq: Every evening | ORAL | Status: DC | PRN

## 2014-05-23 MED ORDER — ONDANSETRON HCL 4 MG/2ML IJ SOLN: 4.0000 mg | Freq: Three times a day (TID) | INTRAMUSCULAR | Status: DC | PRN

## 2014-05-23 MED ORDER — LIDOCAINE HCL (PF) 1 % IJ SOLN
30.0000 mL | INTRAMUSCULAR | Status: DC | PRN
  Filled 2014-05-23: qty 30

## 2014-05-23 MED ORDER — SIMETHICONE 80 MG PO CHEW
80.0000 mg | CHEWABLE_TABLET | ORAL | Status: DC
  Administered 2014-05-23 – 2014-05-25 (×3): 80 mg via ORAL
  Filled 2014-05-23 (×3): qty 1

## 2014-05-23 MED ORDER — ACETAMINOPHEN 325 MG PO TABS: 650.0000 mg | ORAL_TABLET | ORAL | Status: DC | PRN

## 2014-05-23 MED ORDER — ONDANSETRON HCL 4 MG/2ML IJ SOLN: 4.0000 mg | Freq: Four times a day (QID) | INTRAMUSCULAR | Status: DC | PRN

## 2014-05-23 MED ORDER — LACTATED RINGERS IV SOLN
INTRAVENOUS | Status: DC
  Administered 2014-05-23: 14:00:00 via INTRAVENOUS

## 2014-05-23 MED ORDER — DIBUCAINE 1 % RE OINT
1.0000 "application " | TOPICAL_OINTMENT | RECTAL | Status: DC | PRN
  Administered 2014-05-25: 1 via RECTAL
  Filled 2014-05-23: qty 28

## 2014-05-23 MED ORDER — DIPHENHYDRAMINE HCL 25 MG PO CAPS: 25.0000 mg | ORAL_CAPSULE | Freq: Four times a day (QID) | ORAL | Status: DC | PRN

## 2014-05-23 MED ORDER — OXYCODONE-ACETAMINOPHEN 5-325 MG PO TABS
1.0000 | ORAL_TABLET | ORAL | Status: DC | PRN
  Administered 2014-05-24 – 2014-05-25 (×4): 1 via ORAL
  Filled 2014-05-23 (×4): qty 1

## 2014-05-23 MED ORDER — KETOROLAC TROMETHAMINE 30 MG/ML IJ SOLN
INTRAMUSCULAR | Status: AC
  Administered 2014-05-23: 30 mg via INTRAMUSCULAR
  Filled 2014-05-23: qty 1

## 2014-05-23 MED ORDER — WITCH HAZEL-GLYCERIN EX PADS: 1.0000 "application " | MEDICATED_PAD | CUTANEOUS | Status: DC | PRN

## 2014-05-23 MED ORDER — SCOPOLAMINE 1 MG/3DAYS TD PT72
1.0000 | MEDICATED_PATCH | Freq: Once | TRANSDERMAL | Status: DC
Start: 2014-05-23 — End: 2014-05-24
  Filled 2014-05-23: qty 1

## 2014-05-23 MED ORDER — PRENATAL MULTIVITAMIN CH
1.0000 | ORAL_TABLET | Freq: Every day | ORAL | Status: DC
  Administered 2014-05-24 – 2014-05-26 (×3): 1 via ORAL
  Filled 2014-05-23 (×3): qty 1

## 2014-05-23 MED ORDER — PHENYLEPHRINE 40 MCG/ML (10ML) SYRINGE FOR IV PUSH (FOR BLOOD PRESSURE SUPPORT): 80.0000 ug | PREFILLED_SYRINGE | INTRAVENOUS | Status: DC | PRN

## 2014-05-23 MED ORDER — BUPIVACAINE HCL (PF) 0.25 % IJ SOLN
INTRAMUSCULAR | Status: AC
  Filled 2014-05-23: qty 20

## 2014-05-23 MED ORDER — LACTATED RINGERS IV SOLN
INTRAVENOUS | Status: DC
  Administered 2014-05-23: 05:00:00 via INTRAVENOUS

## 2014-05-23 MED ORDER — LANOLIN HYDROUS EX OINT: 1.0000 "application " | TOPICAL_OINTMENT | CUTANEOUS | Status: DC | PRN

## 2014-05-23 MED ORDER — MAGNESIUM HYDROXIDE 400 MG/5ML PO SUSP: 30.0000 mL | ORAL | Status: DC | PRN

## 2014-05-23 MED ORDER — FENTANYL 2.5 MCG/ML BUPIVACAINE 1/10 % EPIDURAL INFUSION (WH - ANES)
INTRAMUSCULAR | Status: DC | PRN
  Administered 2014-05-23: 14 mL/h via EPIDURAL

## 2014-05-23 MED ORDER — MENTHOL 3 MG MT LOZG: 1.0000 | LOZENGE | OROMUCOSAL | Status: DC | PRN

## 2014-05-23 MED ORDER — NALOXONE HCL 1 MG/ML IJ SOLN
1.0000 ug/kg/h | INTRAVENOUS | Status: DC | PRN
  Filled 2014-05-23: qty 2

## 2014-05-23 MED ORDER — KETOROLAC TROMETHAMINE 30 MG/ML IJ SOLN: 30.0000 mg | Freq: Four times a day (QID) | INTRAMUSCULAR | Status: DC | PRN

## 2014-05-23 MED ORDER — LACTATED RINGERS IV SOLN
INTRAVENOUS | Status: DC | PRN
  Administered 2014-05-23 (×2): via INTRAVENOUS

## 2014-05-23 MED ORDER — FENTANYL CITRATE 0.05 MG/ML IJ SOLN: 25.0000 ug | INTRAMUSCULAR | Status: DC | PRN

## 2014-05-23 MED ORDER — CEFAZOLIN SODIUM-DEXTROSE 2-3 GM-% IV SOLR
INTRAVENOUS | Status: DC | PRN
  Administered 2014-05-23: 2 g via INTRAVENOUS

## 2014-05-23 MED ORDER — SIMETHICONE 80 MG PO CHEW
80.0000 mg | CHEWABLE_TABLET | Freq: Three times a day (TID) | ORAL | Status: DC
  Administered 2014-05-24 – 2014-05-26 (×7): 80 mg via ORAL
  Filled 2014-05-23 (×7): qty 1

## 2014-05-23 MED ORDER — EPHEDRINE 5 MG/ML INJ: 10.0000 mg | INTRAVENOUS | Status: DC | PRN

## 2014-05-23 MED ORDER — OXYTOCIN 10 UNIT/ML IJ SOLN
INTRAMUSCULAR | Status: AC
  Filled 2014-05-23: qty 4

## 2014-05-23 MED ORDER — BUPIVACAINE HCL (PF) 0.25 % IJ SOLN
INTRAMUSCULAR | Status: DC | PRN
  Administered 2014-05-23: 30 mL

## 2014-05-23 MED ORDER — MORPHINE SULFATE (PF) 0.5 MG/ML IJ SOLN
INTRAMUSCULAR | Status: DC | PRN
  Administered 2014-05-23: 4 mg via EPIDURAL

## 2014-05-23 MED ORDER — EPHEDRINE 5 MG/ML INJ
10.0000 mg | INTRAVENOUS | Status: DC | PRN
Start: 2014-05-23 — End: 2014-05-23

## 2014-05-23 MED ORDER — DIPHENHYDRAMINE HCL 50 MG/ML IJ SOLN: 12.5000 mg | INTRAMUSCULAR | Status: DC | PRN

## 2014-05-23 SURGICAL SUPPLY — 44 items
ADH SKN CLS APL DERMABOND .7 (GAUZE/BANDAGES/DRESSINGS) ×1
CLAMP CORD UMBIL (MISCELLANEOUS) IMPLANT
CLOTH BEACON ORANGE TIMEOUT ST (SAFETY) ×3 IMPLANT
CONTAINER PREFILL 10% NBF 15ML (MISCELLANEOUS) IMPLANT
DERMABOND ADVANCED (GAUZE/BANDAGES/DRESSINGS) ×2
DERMABOND ADVANCED .7 DNX12 (GAUZE/BANDAGES/DRESSINGS) IMPLANT
DRAPE SHEET LG 3/4 BI-LAMINATE (DRAPES) IMPLANT
DRSG OPSITE POSTOP 4X10 (GAUZE/BANDAGES/DRESSINGS) ×3 IMPLANT
DURAPREP 26ML APPLICATOR (WOUND CARE) ×3 IMPLANT
ELECT REM PT RETURN 9FT ADLT (ELECTROSURGICAL) ×3
ELECTRODE REM PT RTRN 9FT ADLT (ELECTROSURGICAL) ×1 IMPLANT
EXTRACTOR VACUUM M CUP 4 TUBE (SUCTIONS) IMPLANT
EXTRACTOR VACUUM M CUP 4' TUBE (SUCTIONS)
GLOVE BIO SURGEON STRL SZ 6.5 (GLOVE) ×2 IMPLANT
GLOVE BIO SURGEONS STRL SZ 6.5 (GLOVE) ×1
GLOVE BIOGEL PI IND STRL 7.0 (GLOVE) ×1 IMPLANT
GLOVE BIOGEL PI INDICATOR 7.0 (GLOVE) ×2
GOWN STRL REUS W/TWL LRG LVL3 (GOWN DISPOSABLE) ×6 IMPLANT
KIT ABG SYR 3ML LUER SLIP (SYRINGE) IMPLANT
LIQUID BAND (GAUZE/BANDAGES/DRESSINGS) IMPLANT
NDL HYPO 25X5/8 SAFETYGLIDE (NEEDLE) IMPLANT
NEEDLE HYPO 22GX1.5 SAFETY (NEEDLE) ×3 IMPLANT
NEEDLE HYPO 25X5/8 SAFETYGLIDE (NEEDLE) IMPLANT
PACK C SECTION WH (CUSTOM PROCEDURE TRAY) ×3 IMPLANT
PAD OB MATERNITY 4.3X12.25 (PERSONAL CARE ITEMS) ×3 IMPLANT
RTRCTR C-SECT PINK 25CM LRG (MISCELLANEOUS) ×3 IMPLANT
STAPLER VISISTAT 35W (STAPLE) IMPLANT
SUT MNCRL AB 3-0 PS2 27 (SUTURE) ×3 IMPLANT
SUT PLAIN 0 NONE (SUTURE) IMPLANT
SUT PLAIN 2 0 (SUTURE) ×3
SUT PLAIN ABS 2-0 CT1 27XMFL (SUTURE) ×1 IMPLANT
SUT VIC AB 0 CT1 27 (SUTURE) ×6
SUT VIC AB 0 CT1 27XBRD ANBCTR (SUTURE) ×2 IMPLANT
SUT VIC AB 0 CTX 36 (SUTURE) ×6
SUT VIC AB 0 CTX36XBRD ANBCTRL (SUTURE) ×2 IMPLANT
SUT VIC AB 2-0 CT1 27 (SUTURE) ×3
SUT VIC AB 2-0 CT1 TAPERPNT 27 (SUTURE) ×1 IMPLANT
SUT VIC AB 3-0 SH 27 (SUTURE)
SUT VIC AB 3-0 SH 27X BRD (SUTURE) IMPLANT
SUT VIC AB 4-0 PS2 27 (SUTURE) ×3 IMPLANT
SYR CONTROL 10ML LL (SYRINGE) ×3 IMPLANT
TOWEL OR 17X24 6PK STRL BLUE (TOWEL DISPOSABLE) ×3 IMPLANT
TRAY FOLEY CATH 14FR (SET/KITS/TRAYS/PACK) ×3 IMPLANT
WATER STERILE IRR 1000ML POUR (IV SOLUTION) IMPLANT

## 2014-05-23 NOTE — Op Note (Signed)
Preoperative diagnosis: Intrauterine pregnancy at 38 weeks and 2 days                                             Non-reassuring FHR monitoring                                            Unsuccessful Vacuum Extraction  Post operative diagnosis: Same  Anesthesia: Epidural  Anesthesiologist: Dr. Renold DonGermeroth  Procedure: Primary urgent low transverse cesarean section  Surgeon: Dr. Genia DelMarie-Lyne Kahlani Graber  Assistant: none   Estimated blood loss: 850 cc  Procedure:  After being informed of the planned procedure and possible complications including bleeding, infection, injury to other organs, informed consent is obtained. The patient is taken to OR #9 and epidural anesthesia level is raised without complication. She is placed in the dorsal decubitus position with the pelvis tilted to the left. She is then prepped and draped in a sterile fashion. A Foley catheter is already inserted in her bladder.  After assessing adequate level of anesthesia, we infiltrate the suprapubic area with 20 cc of Marcaine 0.25 and perform a Pfannenstiel incision which is brought down sharply to the fascia. The fascia is entered in a low transverse fashion. Linea alba is dissected. Peritoneum is entered in a midline fashion. An Alexis retractor is easily positioned. Visceral peritoneum is entered in a low transverse fashion allowing us to safely retract bladder by developing a bladder flap.  The myometrium is then entered in a low transverse fashion; first with knife and then extended bluntly. Amniotic fluid is clear. We assist the birth of a female  infant in cephalic presentation. Mouth and nose are suctioned. The baby is delivered. The cord is clamped and sectioned. The baby is given to the neonatologist present in the room.  10 cc of blood is drawn from the umbilical vein.  A cord PH is done.  The placenta is allowed to deliver spontaneously. It is complete and the cord has 3 vessels. Uterine revision is negative.  We  proceed with closure of the myometrium in 2 layers: First with a running locked suture of 0 Vicryl, then with a Lembert suture of 0 Vicryl imbricating the first one. Hemostasis is completed with cauterization on peritoneal edges.  Both paracolic gutters are cleaned. Both tubes and ovaries are assessed and normal. We confirm a satisfactory hemostasis.  Retractors and sponges are removed. Under fascia hemostasis is completed with cauterization.  The parietal peritoneum is closed with a running suture of vicryl 2-0.  The fascia is then closed with 2 running sutures of 0 Vicryl meeting midline. The wound is irrigated with warm saline and hemostasis is completed with cauterization.  The adipose tissue is closed with a Plain 2-0 is a running suture. The skin is closed with a subcuticular suture of 3-0 Vicryl and Dermabond.  Honeycomb dressing is applied.  Instrument and sponge count is complete x2. Estimated blood loss is 850 cc.  The procedure is well tolerated by the patient who is taken to recovery room in a well and stable condition.  female baby named Sheria LangCameron was born at 6:11am and received an Apgar of 3  at 1 minute and 7 at 5 minutes and 8 at 10 minutes. Weight was 8 lbs  0 oz.  PH was 6.95   Specimen: Placenta sent to [facility]   Elchonon Maxson,[name] MD 11/28/20157:02 AM

## 2014-05-23 NOTE — Progress Notes (Signed)
Subjective: Doing well, pain controled with epidural, UCs q2-3 min  Anesthesia Epidural   Objective: BP 147/101 mmHg  Pulse 102  Temp(Src) 97.8 F (36.6 C) (Oral)  Resp 24  Ht 5\' 6"  (1.676 m)  Wt 100.064 kg (220 lb 9.6 oz)  BMI 35.62 kg/m2  SpO2 100%  LMP 08/28/2013   FHT:  Bradycardia as we enter the 2nd stage of labor, then tachycardia:  FHR: 175/min bpm, variability: minimal ,  accelerations:  Abscent,  decelerations:  Present mild to moderate variable with each UC UC:   regular, every 2-3 minutes VE:   Dilation: 10 Effacement (%): 100 Station: 0 Exam by:: Tysheka Fanguy   Descent to +1 station with good pushing efforts.  Decision to proceed with Vacuum because of non-reassuring FHR monitoring.  Easy application of Bell shape Vacuum.  Tractions in safety zone x about 6 UCs with minimal descent and cup coming off x 2.  Decision to proceed with Urgent C/S.  Suspicion of macrosomia.     Assessment / Plan: Non-reassuring FHR monitoring.  Unsuccessful Vacuum extraction.  Decision to proceed with urgent C/S.  Informed consent signed.  Fetal Wellbeing:  Category II Pain Control:  Epidural  Anticipated MOD:  Urgent C/S  Melody Wells,Melody Wells 05/23/2014, 5:43 AM

## 2014-05-23 NOTE — Plan of Care (Signed)
Problem: Phase I Progression Outcomes Goal: Pain controlled with appropriate interventions Outcome: Completed/Met Date Met:  05/23/14 Goal: Foley catheter patent Outcome: Completed/Met Date Met:  05/23/14 Goal: OOB as tolerated unless otherwise ordered Outcome: Completed/Met Date Met:  05/23/14 Goal: IS, TCDB as ordered Outcome: Completed/Met Date Met:  05/23/14

## 2014-05-23 NOTE — Progress Notes (Signed)
In to see pt and do sve at RNs request

## 2014-05-23 NOTE — Progress Notes (Signed)
Report called to Dr Seymour BarsLavoie. Aware of sve, nonreactive fhr tracing currently, pos GBS. [facility]

## 2014-05-23 NOTE — Anesthesia Postprocedure Evaluation (Signed)
  Anesthesia Post-op Note  Patient: Melody Wells  Procedure(s) Performed: Procedure(s): CESAREAN SECTION (N/A)  Patient is awake, responsive, moving her legs, and has signs of resolution of her numbness. Pain and nausea are reasonably well controlled. Vital signs are stable and clinically acceptable. Oxygen saturation is clinically acceptable. There are no apparent anesthetic complications at this time. Patient is ready for discharge.

## 2014-05-23 NOTE — MAU Note (Signed)
Mickel CrowKathy Larabe [facility] Olegario MessierKathy given report and pt to 173 via w/c. EFM strip reviewed and aware strip is flat and not reactive. LR fld bolus running

## 2014-05-23 NOTE — Addendum Note (Signed)
Addendum  created 05/23/14 1553 by Renford DillsJanet L Kaleah Hagemeister, CRNA   Modules edited: Notes Section   Notes Section:  File: [id number]

## 2014-05-23 NOTE — Plan of Care (Signed)
Problem: Phase I Progression Outcomes Goal: VS, stable, temp < 100.4 degrees F Outcome: Completed/Met Date Met:  05/23/14  Problem: Phase II Progression Outcomes Goal: Pain controlled on oral analgesia Outcome: Completed/Met Date Met:  05/23/14 Goal: Progress activity as tolerated unless otherwise ordered Outcome: Completed/Met Date Met:  05/23/14 Goal: Afebrile, VS remain stable Outcome: Completed/Met Date Met:  05/23/14 Goal: Tolerating diet Outcome: Completed/Met Date Met:  05/23/14

## 2014-05-23 NOTE — Lactation Note (Signed)
This note was copied from the chart of Melody Urbano HeirLakisha Larsson. Lactation Consultation Note Initial visit at 14 hours of age.  Mom initially attempted breast feeding and has given 3 bottle of formula since.  When asked what mom wants to do she says she wants to breast feed, but needs help.  Hand expression demonstrated with colostrum visible.  Explained supply and demand of breast milk to mom and benefits of breastfeeding exclusively.  Assisted with football hold on left breast.  Mom has good nipples with compressible tissue.  Few attempts and baby latched well with wide flanged lips and rhythmic sucking after several minutes required stimulation to keep feeding.   Observed several minutes.   [facility] given and discussed.  Encouraged to feed with early cues on demand.  Early newborn behavior discussed.  Mom to call for assist as needed.    Patient Name: Melody Wells [id number]'s Date: 05/23/2014 Reason for consult: Initial assessment   Maternal Data Has patient been taught Hand Expression?: Yes Does the patient have breastfeeding experience prior to this delivery?: No  Feeding Feeding Type: Breast Fed Nipple Type: Slow - flow Length of feed:  (several minutes rhythmic sucking)  LATCH Score/Interventions Latch: Grasps breast easily, tongue down, lips flanged, rhythmical sucking.  Audible Swallowing: None Intervention(s): Skin to skin;Hand expression  Type of Nipple: Everted at rest and after stimulation  Comfort (Breast/Nipple): Soft / non-tender     Hold (Positioning): Assistance needed to correctly position infant at breast and maintain latch. Intervention(s): Breastfeeding basics reviewed;Support Pillows;Position options;Skin to skin  LATCH Score: 7  Lactation Tools Discussed/Used     Consult Status Consult Status: Follow-up Date: 05/24/14 Follow-up type: In-patient    Melody Wells, Melody Wells 05/23/2014, 8:18 PM

## 2014-05-23 NOTE — Anesthesia Procedure Notes (Signed)
Epidural Patient location during procedure: OB  Staffing Anesthesiologist: Jamelah Sitzer R Performed by: anesthesiologist   Preanesthetic Checklist Completed: patient identified, pre-op evaluation, timeout performed, IV checked, risks and benefits discussed and monitors and equipment checked  Epidural Patient position: sitting Prep: site prepped and draped and DuraPrep Patient monitoring: heart rate Approach: midline Location: L3-L4 Injection technique: LOR air and LOR saline  Needle:  Needle type: Tuohy  Needle gauge: 17 G Needle length: 9 cm Needle insertion depth: 7 cm Catheter type: closed end flexible Catheter size: 19 Gauge Catheter at skin depth: 13 cm Test dose: negative  Assessment Sensory level: T8 Events: blood not aspirated, injection not painful, no injection resistance, negative IV test and no paresthesia  Additional Notes Reason for block:procedure for pain   

## 2014-05-23 NOTE — H&P (Addendum)
Melody Wells is a 33 y.o. female G2P0010 329w2d presenting for UCs.  HPI:  FMs present.  No vaginal bleeding.  SROM clear AF upon arrival at L+D.  No PIH Sx.  OB History    Gravida Para Term Preterm AB TAB SAB Ectopic Multiple Living   2    1 1  0        Past Medical History  Diagnosis Date  . Medical history non-contributory   . Anemia   . Sickle cell trait    Past Surgical History  Procedure Laterality Date  . Cyst removal on left wrist    . Induced abortion    . Ganglion cyst excision      x2, left wrist   Family History: family history is negative for Hearing loss. Social History:  reports that she has never smoked. She has never used smokeless tobacco. She reports that she does not drink alcohol or use illicit drugs.  No Known Allergies  Dilation: 9 Effacement (%): 100 Station: -1 Exam by:: Dr. Seymour BarsLavoie   FHR 130's good variability, no deceleration  Blood pressure 142/87, pulse 106, temperature 97.9 F (36.6 C), temperature source Oral, resp. rate 20, height 5\' 6"  (1.676 m), weight 100.064 kg (220 lb 9.6 oz), last menstrual period 08/28/2013. Exam Physical Exam  HPP:  Patient Active Problem List   Diagnosis Date Noted  . Active labor 05/23/2014    Prenatal labs: ABO, Rh: A/Positive/-- (05/06 0000) Antibody: Negative (05/06 0000) Rubella:   RPR: Nonreactive (05/06 0000)  HBsAg: Negative (05/06 0000)  HIV: Non-reactive (05/06 0000)  Genetic testing: Ultrascreen wnl US anato: wnl 1 hr GTT: wnl GBS: Positive (11/09 0000)   Assessment/Plan: 38 2/7 wks active labor/SROM on admission.  Fetal well-being reassuring.  Having an epidural.  Preparing for vaginal delivery.  Ampi IV given for GBS.  Sickle cell trait.   Melody Wells 05/23/2014, 4:16 AM

## 2014-05-23 NOTE — Transfer of Care (Signed)
Immediate Anesthesia Transfer of Care Note  Patient: Melody Wells  Procedure(s) Performed: Procedure(s): CESAREAN SECTION (N/A)  Patient Location: PACU  Anesthesia Type:Epidural  Level of Consciousness: awake, alert  and oriented  Airway & Oxygen Therapy: Patient Spontanous Breathing  Post-op Assessment: Report given to PACU RN and Post -op Vital signs reviewed and stable  Post vital signs: Reviewed and stable  Complications: No apparent anesthesia complications

## 2014-05-23 NOTE — Anesthesia Postprocedure Evaluation (Signed)
  Anesthesia Post-op Note  Patient: Melody Wells  Procedure(s) Performed: Procedure(s): CESAREAN SECTION (N/A)  Patient Location: Mother/Baby  Anesthesia Type:Epidural  Level of Consciousness: awake  Airway and Oxygen Therapy: Patient Spontanous Breathing  Post-op Pain: mild  Post-op Assessment: Patient's Cardiovascular Status Stable and Respiratory Function Stable  Post-op Vital Signs: stable  Last Vitals:  Filed Vitals:   05/23/14 1429  BP: 129/72  Pulse: 123  Temp:   Resp:     Complications: No apparent anesthesia complications

## 2014-05-23 NOTE — MAU Note (Signed)
Contractions since Thrus am. Stronger last 2-3 hours. Some mucousy d/c. Denies LOF.

## 2014-05-23 NOTE — Plan of Care (Signed)
Problem: Phase I Progression Outcomes Goal: Initial discharge plan identified Outcome: Completed/Met Date Met:  05/23/14     

## 2014-05-23 NOTE — Anesthesia Preprocedure Evaluation (Signed)
Anesthesia Evaluation  Patient identified by MRN, date of birth, ID band Patient awake    Reviewed: Allergy & Precautions, H&P , NPO status , Patient's Chart, lab work & pertinent test results  Airway Mallampati: II  TM Distance: >3 FB Neck ROM: Full    Dental no notable dental hx.    Pulmonary neg pulmonary ROS,  breath sounds clear to auscultation  Pulmonary exam normal       Cardiovascular negative cardio ROS  Rhythm:Regular Rate:Normal     Neuro/Psych negative neurological ROS  negative psych ROS   GI/Hepatic negative GI ROS, Neg liver ROS,   Endo/Other  negative endocrine ROS  Renal/GU negative Renal ROS     Musculoskeletal negative musculoskeletal ROS (+)   Abdominal   Peds  Hematology  (+) anemia ,   Anesthesia Other Findings   Reproductive/Obstetrics (+) Pregnancy                             Anesthesia Physical Anesthesia Plan  ASA: II  Anesthesia Plan: Epidural   Post-op Pain Management:    Induction:   Airway Management Planned:   Additional Equipment:   Intra-op Plan:   Post-operative Plan:   Informed Consent: I have reviewed the patients History and Physical, chart, labs and discussed the procedure including the risks, benefits and alternatives for the proposed anesthesia with the patient or authorized representative who has indicated his/her understanding and acceptance.     Plan Discussed with:   Anesthesia Plan Comments:         Anesthesia Quick Evaluation

## 2014-05-24 LAB — CBC
HEMATOCRIT: 30.5 % — AB (ref 36.0–46.0)
HEMOGLOBIN: 10.4 g/dL — AB (ref 12.0–15.0)
MCH: 28 pg (ref 26.0–34.0)
MCHC: 34.8 g/dL (ref 30.0–36.0)
MCV: 80.5 fL (ref 78.0–100.0)
Platelets: 182 10*3/uL (ref 150–400)
RBC: 3.79 MIL/uL — AB (ref 3.87–5.11)
RDW: 14.2 % (ref 11.5–15.5)
WBC: 12.2 10*3/uL — AB (ref 4.0–10.5)

## 2014-05-24 NOTE — Lactation Note (Signed)
This note was copied from the chart of Melody Wells. Lactation Consultation Note Follow up consult with this mom and baby, now 7533 hours old, and baby is 4338 3/7 weeks CGA. Mom has been breast feeding and then supplementing with 3-5 mls of formula. I reviewed LEAD with her and supply and demand. She c/o tender nipples  -I  Showed mom how to hand express milk and apply to her nipples. I also showed mom that she has lots of colostrum, and that formula is not needed, and is why the baby is only taking a few mls after breast feeding. Mom to call for latch to be observed, to make sure she is lataching deep enough, and positioning herself and baby properly.   Patient Name: Melody Urbano HeirLakisha Sicard ZOXWR'UToday's Date: 05/24/2014 Reason for consult: Follow-up assessment   Maternal Data    Feeding    LATCH Score/Interventions       Type of Nipple: Everted at rest and after stimulation  Comfort (Breast/Nipple): Filling, red/small blisters or bruises, mild/mod discomfort  Problem noted: Mild/Moderate discomfort Interventions  (Cracked/bleeding/bruising/blister): Expressed breast milk to nipple  Intervention(s): Breastfeeding basics reviewed;Support Pillows;Position options     Lactation Tools Discussed/Used     Consult Status Consult Status: Follow-up Date: 05/25/14 Follow-up type: In-patient    Alfred LevinsLee, Collie Wernick Anne 05/24/2014, 3:19 PM

## 2014-05-24 NOTE — Progress Notes (Signed)
POSTOPERATIVE DAY # 1 S/P CS after unsuccessful VAC assisted vaginal delivery / non-reasssuring FHR   S:         Reports feeling sore all over with swelling in bottom             Tolerating po intake / no nausea / no vomiting / no flatus / no BM             Bleeding is light             Pain controlled with motrin and percocet             Up ad lib / ambulatory/ voiding QS  Newborn breast & formula feeding  / Circumcision planned today  O:  VS: BP 114/71 mmHg  Pulse 96  Temp(Src) 98.1 F (36.7 C) (Axillary)  Resp 18  Ht 5\' 6"  (1.676 m)  Wt 100.064 kg (220 lb 9.6 oz)  BMI 35.62 kg/m2  SpO2 99%  LMP 08/28/2013  Breastfeeding? Unknown   LABS:               Recent Labs  05/23/14 0255 05/24/14 0625  WBC 10.4 12.2*  HGB 13.5 10.4*  PLT 204 182               Bloodtype: A/Positive/-- (05/06 0000)  Rubella: Immune (05/06 0000)                                             I&O: net negative 535 ml             Physical Exam:             Alert and Oriented X3  Lungs: Clear and unlabored  Heart: regular rate and rhythm / no mumurs  Abdomen: soft, non-tender, non-distended, hypoactive BS                                   small edema in base on panus at midline - no erythema             Fundus: firm, non-tender, Ueven             Dressing intact honeycomb              Incision:  approximated with suture / no erythema / no ecchymosis / no drainage  Perineum: intact with moderate edema - ice pack in place  Lochia: light  Extremities: trace depedent edema, no calf pain or tenderness, negative Homans  A:        POD # 1 S/P CS            mild ABL anemia - stable  P:        routine postoperative care             advance pot-op care             consider early DC tomorrow             circumcision by Dr Seymour BarsLavoie - reviewed immediate circumcision care     Marlinda MikeBAILEY, Melody Krone CNM, MSN, [facility] 05/24/2014, 10:12 AM

## 2014-05-24 NOTE — Plan of Care (Signed)
Problem: Phase I Progression Outcomes Goal: Voiding adequately Outcome: Completed/Met Date Met:  05/24/14

## 2014-05-24 NOTE — Lactation Note (Signed)
This note was copied from the chart of Melody Urbano HeirLakisha Tarkowski. Lactation Consultation Note Follow up visit at 34 hours of age.  Baby is showing feeding cues.  Assisted with football hold and pillow support.  Mom is sitting at side of the bed, but reports she wants to feed in this position due to pain.  Encouraged mom to allow baby a wide open mouth prior to latch and to use hand to compress breast for latch on and feeding as needed.  Mom will continue to need reinforcement for positioning and encouraged to keep practicing.  Baby is able to get wide deep latch with assistance.  Baby maintains rhythmic sucking for several minutes and then stimulation needed to continue feeding.  Mom denies pain with latch and knows to call for assist as needed.  Mom is able to demonstrate hand expression with colostrum visible.    Patient Name: Melody Wells VWUJW'JToday's Date: 05/24/2014 Reason for consult: Follow-up assessment;Difficult latch   Maternal Data    Feeding Feeding Type: Breast Fed Length of feed:  (observed 10 minutes)  LATCH Score/Interventions Latch: Grasps breast easily, tongue down, lips flanged, rhythmical sucking. Intervention(s): Adjust position;Assist with latch;Breast massage;Breast compression  Audible Swallowing: A few with stimulation Intervention(s): Skin to skin;Hand expression  Type of Nipple: Everted at rest and after stimulation  Comfort (Breast/Nipple): Soft / non-tender  Problem noted: Mild/Moderate discomfort Interventions  (Cracked/bleeding/bruising/blister): Expressed breast milk to nipple  Hold (Positioning): Assistance needed to correctly position infant at breast and maintain latch. Intervention(s): Breastfeeding basics reviewed;Support Pillows;Position options;Skin to skin  LATCH Score: 8  Lactation Tools Discussed/Used     Consult Status Consult Status: Follow-up Date: 05/25/14 Follow-up type: In-patient    Jannifer RodneyShoptaw, Jana Lynn 05/24/2014, 4:58 PM

## 2014-05-25 ENCOUNTER — Encounter (HOSPITAL_COMMUNITY): Payer: Self-pay | Admitting: Obstetrics & Gynecology

## 2014-05-25 NOTE — Lactation Note (Signed)
This note was copied from the chart of Melody Urbano [name]. Lactation Consultation Note New mom needs more confidence in her BF. Has good everted nipples. Hand expression done to demonstrated colostrum.heard good swallows. Had good latch. Mom nervous about d/c home. Has large pendulum breast, hand expression noted good colostrum. Reviewed Bf techniques, and baby cues. Reviewed positions in BF and being comfortable. Did note occasional smacking sound. Baby dosen't get a taught seal. Baby does a chewing motion. Explained suck training.  Patient Name: Melody Wells ZOXWR'UToday's Date: 05/25/2014 Reason for consult: Follow-up assessment;Difficult latch   Maternal Data    Feeding Feeding Type: Breast Fed Length of feed: 20 min  LATCH Score/Interventions Latch: Grasps breast easily, tongue down, lips flanged, rhythmical sucking. Intervention(s): Adjust position;Assist with latch;Breast massage;Breast compression  Audible Swallowing: A few with stimulation Intervention(s): Skin to skin;Hand expression Intervention(s): Hand expression;Alternate breast massage  Type of Nipple: Everted at rest and after stimulation  Comfort (Breast/Nipple): Soft / non-tender  Interventions  (Cracked/bleeding/bruising/blister): Expressed breast milk to nipple Interventions (Mild/moderate discomfort): Hand massage;Hand expression  Hold (Positioning): Assistance needed to correctly position infant at breast and maintain latch. Intervention(s): Breastfeeding basics reviewed;Support Pillows;Position options;Skin to skin  LATCH Score: 8  Lactation Tools Discussed/Used     Consult Status Consult Status: Follow-up Date: 05/25/14 Follow-up type: In-patient    Charyl DancerCARVER, Shimon Trowbridge G 05/25/2014, 6:31 AM

## 2014-05-25 NOTE — Lactation Note (Signed)
This note was copied from the chart of Melody Urbano HeirLakisha Zuk. Lactation Consultation Note  Baby sleeping in crib.  States he breastfed recently for approx 15 min. Denies problems or concerns and states baby is breastfeeding well. Encouraged mother to call for further assistance.  Patient Name: Melody Wells Reason for consult: Follow-up assessment   Maternal Data    Feeding    LATCH Score/Interventions                      Lactation Tools Discussed/Used     Consult Status Consult Status: Follow-up Date: 05/26/14 Follow-up type: In-patient    Dahlia ByesBerkelhammer, [name] [facility] CenterBoschen Wells, 5:40 PM

## 2014-05-25 NOTE — Progress Notes (Signed)
POSTOPERATIVE DAY # 2 S/P CS after unsuccessful VAC assisted vaginal delivery / non-reasssuring FHR   S:         Reports feeling not well, sore all over especially with activity, did not sleep at all last night             Tolerating po intake / no nausea / no vomiting / no flatus / no BM             Bleeding is light             Pain controlled withMotrin and Percocet             Up ad lib - with difficulty due to soreness / ambulatory/ voiding QS  Newborn breast and formula feeding  / Circumcision done on 05/24/14   O:  VS: BP 122/65 mmHg  Pulse 98  Temp(Src) 98.6 F (37 C) (Oral)  Resp 18  Ht 5\' 6"  (1.676 m)  Wt 100.064 kg (220 lb 9.6 oz)  BMI 35.62 kg/m2  SpO2 99%  LMP 08/28/2013  Breastfeeding? Unknown   LABS:               Recent Labs  05/23/14 0255 05/24/14 0625  WBC 10.4 12.2*  HGB 13.5 10.4*  PLT 204 182               Bloodtype: A/Positive/-- (05/06 0000)  Rubella: Immune (05/06 0000)                                Physical Exam:             Alert and Oriented X3  Lungs: Clear and unlabored  Heart: regular rate and rhythm / no mumurs  Abdomen: soft, non-tender, non-distended, active bowel sounds in all 4 quadrants, dependent edema over pannus              Fundus: firm, non-tender, U-1             Dressing: honey comb dressing intact              Incision:  approximated with sutures / no erythema / no ecchymosis / no drainage  Perineum: dependent edema over mons pubis and labia, ecchymosis present  Lochia: light, red bleeding  Extremities: non-pitting dependent edema, no calf pain or tenderness, negative Homans negative bilaterally  A:        POD # 2 S/P CS after unsuccessful VAC assisted vaginal delivery / non-reasssuring FHR            Mild ABL Anemia - stable  P:        Routine postoperative care             Comfort measures for flatus pain - warm liquids, K-pad, shower            Instructed to rest this AM, and ambulate this afternoon/evening      Increase Motrin to 800mg  every 6 hours, instructed to take pain medicine around the clock            Void every 2-3 hours to avoid bladder distention   Milinda CaveMeredith Tava Peery, SNM

## 2014-05-25 NOTE — Plan of Care (Signed)
Problem: Discharge Progression Outcomes Goal: Activity appropriate for discharge plan Outcome: Completed/Met Date Met:  05/25/14 Goal: Tolerating diet Outcome: Completed/Met Date Met:  05/25/14     

## 2014-05-26 ENCOUNTER — Ambulatory Visit: Payer: Self-pay

## 2014-05-26 MED ORDER — OXYCODONE-ACETAMINOPHEN 5-325 MG PO TABS: 1.0000 | ORAL_TABLET | ORAL | Status: DC | PRN

## 2014-05-26 MED ORDER — IBUPROFEN 600 MG PO TABS: 600.0000 mg | ORAL_TABLET | Freq: Four times a day (QID) | ORAL | Status: DC

## 2014-05-26 MED ORDER — FERROUS SULFATE 325 (65 FE) MG PO TABS: 325.0000 mg | ORAL_TABLET | Freq: Two times a day (BID) | ORAL | Status: DC

## 2014-05-26 NOTE — Plan of Care (Signed)
Problem: Discharge Progression Outcomes Goal: Barriers To Progression Addressed/Resolved Outcome: Completed/Met Date Met:  95/39/67 Goal: Complications resolved/controlled Outcome: Completed/Met Date Met:  05/26/14 Goal: Pain controlled with appropriate interventions Outcome: Completed/Met Date Met:  05/26/14 Goal: Afebrile, VS remain stable at discharge Outcome: Completed/Met Date Met:  05/26/14

## 2014-05-26 NOTE — Progress Notes (Signed)
Patient ID: Melody Wells, female   DOB: May 17, 1981, 33 y.o.   MRN: [id number] Subjective: POD# 3 S/P CS after unsuccessful VAVD / non-reasssuring FHR  Information for the patient's newborn:  Melody Wells, Boy Almeda [[id number]][[id number]]  female  / circ done  Reports feeling better than yesterday / able to get up moving around with minimal pain Feeding: breast Patient reports tolerating PO.  Breast symptoms: none Pain controlled with ibuprofen (OTC) and narcotic analgesics including Percocet Denies HA/SOB/C/P/N/V/dizziness. Flatus present. (+) BM. She reports vaginal bleeding as normal, without clots.  She is ambulating, urinating without difficult.     Objective:   VS:  Filed Vitals:   05/24/14 1710 05/25/14 0645 05/25/14 1739 05/26/14 0541  BP: 126/62 122/65 130/69 151/80  Pulse: 111 98 99 92  Temp: 98.6 F (37 C) 98.6 F (37 C) 98.9 F (37.2 C) 97.9 F (36.6 C)  TempSrc: Oral  Oral Oral  Resp: 18 18  18   Height:      Weight:      SpO2:   98%     No intake or output data in the 24 hours ending 05/26/14 0901      Recent Labs  05/24/14 0625  WBC 12.2*  HGB 10.4*  HCT 30.5*  PLT 182     Blood type: A/Positive/-- (05/06 0000)  Rubella: Immune (05/06 0000)     Physical Exam:  General: alert, cooperative and no distress CV: Regular rate and rhythm, S1S2 present or without murmur or extra heart sounds Resp: clear Abdomen: soft, nontender, edematous pannus, normal bowel sounds Incision: Tegaderm and Homeycomb dressing C/D/I - skin well-approximated with sutures Uterine Fundus: firm, 2 FB below umbilicus, nontender Lochia: minimal Ext: edema trace and Homans sign is negative, no sign of DVT   Assessment/Plan: 33 y.o.   POD# 3.  s/p Cesarean Delivery.  Indications: failed VAVD / non-reassuring FHR                Principal Problem:   Postpartum care following cesarean delivery (11/28) Active Problems:   Active labor   Postoperative state  Doing well, stable.                Regular diet as tolerated Ambulate Routine post-op care D/C Home today  Melody Wells, Kary Sugrue, M, MSN, CNM 05/26/2014, 9:01 AM

## 2014-05-26 NOTE — Plan of Care (Signed)
Problem: Consults Goal: Postpartum Patient Education (See Patient Education module for education specifics.)  Outcome: Completed/Met Date Met:  05/26/14  Problem: Discharge Progression Outcomes Goal: Discharge plan in place and appropriate Outcome: Completed/Met Date Met:  05/26/14

## 2014-05-26 NOTE — Discharge Summary (Signed)
POSTOPERATIVE DISCHARGE SUMMARY:  Patient ID: Melody Wells MRN: [id number] DOB/AGE: 32-09-1980 32 y.o.  Admit date: 05/23/2014 Admission Diagnoses: Active Labor at Term   Discharge date:  05/26/2014 Discharge Diagnoses: S/P Primary C/S due to Failed VAVD and Non-Reassuring FHR on 05/23/2014        Prenatal history: G2P1011   EDC : 06/04/2014, Ultrasound [facility] since 8.[redacted] wks gestation. Primary provider : Dr. Cherly Hensenousins Prenatal course complicated by Sickle Cell Trait / placenta previa / S>D   Prenatal Labs: ABO, Rh: A/Positive/-- (05/06 0000)  Antibody: Negative (05/06 0000) Rubella: Immune (05/06 0000)  RPR: NON REAC (11/28 0255)  HBsAg: Negative (05/06 0000)  HIV: Non-reactive (05/06 0000)  GTT : Normal GBS: Positive (11/09 0000)   Medical / Surgical History :  Past medical history:  Past Medical History  Diagnosis Date  . Medical history non-contributory   . Anemia   . Sickle cell trait     Past surgical history:  Past Surgical History  Procedure Laterality Date  . Cyst removal on left wrist    . Induced abortion    . Ganglion cyst excision      x2, left wrist  . Cesarean section N/A 05/23/2014    Procedure: CESAREAN SECTION;  Surgeon: Genia DelMarie-Lyne Lavoie, MD;  Location: [facility];  Service: Obstetrics;  Laterality: N/A;     Allergies: Review of patient's allergies indicates no known allergies.   Intrapartum Course:  Admitted for active labor at term / epidural for pain management / rapid progression to complete dilation / Bradycardia in beginning of the 2nd stage of labor, then tachycardia / Descent to +1 station with good pushing efforts. Decision to proceed with Vacuum because of non-reassuring FHR monitoring. Easy application of Bell shape Vacuum. Tractions in safety zone x about 6 UCs with minimal descent and cup coming off x 2. Decision to proceed with Urgent C/S. Suspicion of macrosomia / urgent primary  cesarean delivery of viable female infant by Dr. Seymour BarsLavoie  Physical Exam:   VSS: Blood pressure 151/80, pulse 92, temperature 97.9 F (36.6 C), temperature source Oral, resp. rate 18, height 5\' 6"  (1.676 m), weight 100.064 kg (220 lb 9.6 oz), last menstrual period 08/28/2013, SpO2 98 %, unknown if currently breastfeeding.  LABS:  Recent Labs  05/24/14 0625  WBC 12.2*  HGB 10.4*  PLT 182    Newborn Data Live born female  Birth Weight: 7 lb 15.9 oz (3625 g) APGAR: 3, 7  See operative report for further details  Home with mother.  Discharge Instructions:  Wound Care: keep clean and dry / remove honeycomb POD 7 Postpartum Instructions: [facility] discharge booklet - instructions reviewed Medications:    Medication List    TAKE these medications        ferrous sulfate 325 (65 FE) MG tablet  Take 1 tablet (325 mg total) by mouth 2 (two) times daily with a meal.     ibuprofen 600 MG tablet  Commonly known as:  ADVIL,MOTRIN  Take 1 tablet (600 mg total) by mouth every 6 (six) hours.     oxyCODONE-acetaminophen 5-325 MG per tablet  Commonly known as:  PERCOCET/ROXICET  Take 1 tablet by mouth every 4 (four) hours as needed (for pain scale less than 7).     prenatal multivitamin Tabs tablet  Take 1 tablet by mouth daily at 12 noon.           Follow-up Information    Follow up with  Omran Keelin A, MD. Schedule an appointment as soon as possible for a visit in 6 weeks.   Specialty:  Obstetrics and Gynecology   Why:  postpartum visit   Contact information:   [address] [place] [id number] [phone]         Signed: Raelyn MoraAWSON, ROLITTA, Judie PetitM, MSN, CNM 05/26/2014, 9:07 AM

## 2014-05-26 NOTE — Lactation Note (Signed)
This note was copied from the chart of Boy Urbano HeirLakisha Meth. Lactation Consultation Note BF much better per mom. Baby appears satisfied after BF. C/o sore nipples, slight posterior bruising to Rt. Nipple and positional stripes to Lt. Nipple. Comfort gels given.   Discussed engorgement and prevention. Reviewed positions of feeding and obtaining and maintaining deep latch.  Encouraged support groups and [facility] for [facility] if needed or has questions. Mom is excited about going home. Patient Name: Boy Urbano HeirLakisha Lindahl ZOXWR'UToday's Date: 05/26/2014 Reason for consult: Follow-up assessment;Breast/nipple pain   Maternal Data    Feeding Feeding Type: Bottle Fed - Formula  LATCH Score/Interventions       Type of Nipple: Everted at rest and after stimulation  Comfort (Breast/Nipple): Filling, red/small blisters or bruises, mild/mod discomfort  Problem noted: Mild/Moderate discomfort Interventions  (Cracked/bleeding/bruising/blister): Expressed breast milk to nipple Interventions (Mild/moderate discomfort): Comfort gels  Intervention(s): Breastfeeding basics reviewed;Position options;Support Pillows     Lactation Tools Discussed/Used     Consult Status Consult Status: Complete Date: 05/26/14 Follow-up type: Call as needed    Labrisha Wuellner, Diamond NickelLAURA G 05/26/2014, 11:55 AM

## 2014-05-26 NOTE — Discharge Instructions (Signed)
Breast Pumping Tips °If you are breastfeeding, there may be times when you cannot feed your baby directly. Returning to work or going on a trip are common examples. Pumping allows you to store breast milk and feed it to your baby later.  °You may not get much milk when you first start to pump. Your breasts should start to make more after a few days. If you pump at the times you usually feed your baby, you may be able to keep making enough milk to feed your baby without also using formula. The more often you pump, the more milk you will produce.  °WHEN SHOULD I PUMP?  °· You can begin to pump soon after delivery. However, some experts recommend waiting about 4 weeks before giving your infant a bottle to make sure breastfeeding is going well.  °· If you plan to return to work, begin pumping a few weeks before. This will help you develop techniques that work best for you. It also lets you build up a supply of breast milk.   °· When you are with your infant, feed on demand and pump after each feeding.   °· When you are away from your infant for several hours, pump for about 15 minutes every 2-3 hours. Pump both breasts at the same time if you can.   °· If your infant has a formula feeding, make sure to pump around the same time.     °· If you drink any alcohol, wait 2 hours before pumping.   °HOW DO I PREPARE TO PUMP? °Your let-down reflex is the natural reaction to stimulation that makes your breast milk flow. It is easier to stimulate this reflex when you are relaxed. Find relaxation techniques that work for you. If you have difficulty with your let-down reflex, try these methods:  °· Smell one of your infant's blankets or an item of clothing.   °· Look at a picture or video of your infant.   °· Sit in a quiet, private space.   °· Massage the breast you plan to pump.   °· Place soothing warmth on the breast.   °· Play relaxing music.   °WHAT ARE SOME GENERAL BREAST PUMPING TIPS? °· Wash your hands before you pump. You  do not need to wash your nipples or breasts. °· There are three ways to pump. °¨ You can use your hand to massage and compress your breast. °¨ You can use a handheld manual pump. °¨ You can use an electric pump.   °· Make sure the suction cup (flange) on the breast pump is the right size. Place the flange directly over the nipple. If it is the wrong size or placed the wrong way, it may be painful and cause nipple damage.   °· If pumping is uncomfortable, apply a small amount of purified or modified lanolin to your nipple and areola. °· If you are using an electric pump, adjust the speed and suction power to be more comfortable. °· If pumping is painful or if you are not getting very much milk, you may need a different type of pump. A lactation consultant can help you determine what type of pump to use.   °· Keep a full water bottle near you at all times. Drinking lots of fluid helps you make more milk.  °· You can store your milk to use later. Pumped breast milk can be stored in a sealable, sterile container or plastic bag. Label all stored breast milk with the date you pumped it. °¨ Milk can stay out at room temperature for up to 8 hours. °¨   You can store your milk in the refrigerator for up to 8 days. °¨ You can store your milk in the freezer for 3 months. Thaw frozen milk using warm water. Do not put it in the microwave. °· Do not smoke. Smoking can lower your milk supply and harm your infant. If you need help quitting, ask your health care provider to recommend a program.   °WHEN SHOULD I CALL MY HEALTH CARE PROVIDER OR A LACTATION CONSULTANT? °· You are having trouble pumping. °· You are concerned that you are not making enough milk. °· You have nipple pain, soreness, or redness. °· You want to use birth control. Birth control pills may lower your milk supply. Talk to your health care provider about your options. °Document Released: 11/30/2009 Document Revised: 06/17/2013 Document Reviewed:  04/04/2013 °Exit[employer]® Patient Information ©2015 [employer]. This information is not intended to replace advice given to you by your health care provider. Make sure you discuss any questions you have with your health care provider. ° °Nutrition for the New Mother  °A new mother needs good health and nutrition so she can have energy to take care of a new baby. Whether a mother breastfeeds or formula feeds the baby, it is important to have a well-balanced diet. Foods from all the food groups should be chosen to meet the new mother's energy needs and to give her the nutrients needed for repair and healing.  °A HEALTHY EATING PLAN °The My [employer] plan for Moms outlines what you should eat to help you and your baby stay healthy. The energy and amount of food you need depends on whether or not you are breastfeeding. If you are breastfeeding you will need more nutrients. If you choose not to breastfeed, your nutrition goal should be to return to a healthy weight. Limiting calories may be needed if you are not breastfeeding.  °HOME CARE INSTRUCTIONS  °· For a personal plan based on your unique needs, see your Registered Dietitian or visit www.mypyramid.gov. °· Eat a variety of foods. The plan below will help guide you. The following chart has a suggested daily meal plan from the [employer] for Moms. °· Eat a variety of fruits and vegetables. °· Eat more dark green and orange vegetables and cooked dried beans. °· Make half your grains whole grains. Choose whole instead of refined grains. °· Choose low-fat or lean meats and poultry. °· Choose low-fat or fat-free dairy products like milk, cheese, or yogurt. °Fruits °· Breastfeeding: 2 cups °· Non-Breastfeeding: 2 cups °· What Counts as a serving? °¨ 1 cup of fruit or juice. °¨ ½ cup dried fruit. °Vegetables °· Breastfeeding: 3 cups °· Non-Breastfeeding: 2 ½ cups °· What Counts as a serving? °¨ 1 cup raw or cooked vegetables. °¨ Juice or 2 cups raw leafy  vegetables. °Grains °· Breastfeeding: 8 oz °· Non-Breastfeeding: 6 oz °· What Counts as a serving? °¨ 1 slice bread. °¨ 1 oz ready-to-eat cereal. °¨ ½ cup cooked pasta, rice, or cereal. °Meat and Beans °· Breastfeeding: 6 ½ oz °· Non-Breastfeeding: 5 ½ oz °· What Counts as a serving? °¨ 1 oz lean meat, poultry, or fish °¨ ¼ cup cooked dry beans °¨ ½ oz nuts or 1 egg °¨ 1 tbs peanut butter °Milk °· Breastfeeding: 3 cups °· Non-Breastfeeding: 3 cups °· What Counts as a serving? °¨ 1 cup milk. °¨ 8 oz yogurt. °¨ 1 ½ oz cheese. °¨ 2 oz processed cheese. °TIPS FOR THE BREASTFEEDING MOM °· Rapid weight   loss is not suggested when you are breastfeeding. By simply breastfeeding, you will be able to lose the weight gained during your pregnancy. Your caregiver can keep track of your weight and tell you if your weight loss is appropriate. °· Be sure to drink fluids. You may notice that you are thirstier than usual. A suggestion is to drink a glass of water or other beverage whenever you breastfeed. °· Avoid alcohol as it can be passed into your breast milk. °· Limit caffeine drinks to no more than 2 to 3 cups per day. °· You may need to keep taking your prenatal vitamin while you are breastfeeding. Talk with your caregiver about taking a vitamin or supplement. °RETURING TO A HEALTHY WEIGHT °· The My [employer] Plan for Moms will help you return to a healthy weight. It will also provide the nutrients you need. °· You may need to limit "empty" calories. These include: °¨ High fat foods like fried foods, fatty meats, fast food, butter, and mayonnaise. °¨ High sugar foods like sodas, jelly, candy, and sweets. °· Be physically active. Include 30 minutes of exercise or more each day. Choose an activity you like such as walking, swimming, biking, or aerobics. Check with your caregiver before you start to exercise. °Document Released: 09/19/2007 Document Revised: 09/04/2011 Document Reviewed: 09/19/2007 °ExitCare® Patient Information  ©2015 [employer]. This information is not intended to replace advice given to you by your health care provider. Make sure you discuss any questions you have with your health care provider. °Postpartum Depression and Baby Blues °The postpartum period begins right after the birth of a baby. During this time, there is often a great amount of joy and excitement. It is also a time of many changes in the life of the parents. Regardless of how many times a mother gives birth, each child brings new challenges and dynamics to the family. It is not unusual to have feelings of excitement along with confusing shifts in moods, emotions, and thoughts. All mothers are at risk of developing postpartum depression or the "baby blues." These mood changes can occur right after giving birth, or they may occur many months after giving birth. The baby blues or postpartum depression can be mild or severe. Additionally, postpartum depression can go away rather quickly, or it can be a long-term condition.  °CAUSES °Raised hormone levels and the rapid drop in those levels are thought to be a main cause of postpartum depression and the baby blues. A number of hormones change during and after pregnancy. Estrogen and progesterone usually decrease right after the delivery of your baby. The levels of thyroid hormone and various cortisol steroids also rapidly drop. Other factors that play a role in these mood changes include major life events and genetics.  °RISK FACTORS °If you have any of the following risks for the baby blues or postpartum depression, know what symptoms to watch out for during the postpartum period. Risk factors that may increase the likelihood of getting the baby blues or postpartum depression include: °· Having a personal or family history of depression.   °· Having depression while being pregnant.   °· Having premenstrual mood issues or mood issues related to oral contraceptives. °· Having a lot of life stress.   °· Having  marital conflict.   °· Lacking a social support network.   °· Having a baby with special needs.   °· Having health problems, such as diabetes.   °SIGNS AND SYMPTOMS °Symptoms of baby blues include: °· Brief changes in mood, such as going   from extreme happiness to sadness. °· Decreased concentration.   °· Difficulty sleeping.   °· Crying spells, tearfulness.   °· Irritability.   °· Anxiety.   °Symptoms of postpartum depression typically begin within the first month after giving birth. These symptoms include: °· Difficulty sleeping or excessive sleepiness.   °· Marked weight loss.   °· Agitation.   °· Feelings of worthlessness.   °· Lack of interest in activity or food.   °Postpartum psychosis is a very serious condition and can be dangerous. Fortunately, it is rare. Displaying any of the following symptoms is cause for immediate medical attention. Symptoms of postpartum psychosis include:  °· Hallucinations and delusions.   °· Bizarre or disorganized behavior.   °· Confusion or disorientation.   °DIAGNOSIS  °A diagnosis is made by an evaluation of your symptoms. There are no medical or lab tests that lead to a diagnosis, but there are various questionnaires that a health care provider may use to identify those with the baby blues, postpartum depression, or psychosis. Often, a screening tool called the Edinburgh Postnatal Depression Scale is used to diagnose depression in the postpartum period.  °TREATMENT °The baby blues usually goes away on its own in 1-2 weeks. Social support is often all that is needed. You will be encouraged to get adequate sleep and rest. Occasionally, you may be given medicines to help you sleep.  °Postpartum depression requires treatment because it can last several months or longer if it is not treated. Treatment may include individual or group therapy, medicine, or both to address any social, physiological, and psychological factors that may play a role in the depression. Regular exercise, a  healthy diet, rest, and social support may also be strongly recommended.  °Postpartum psychosis is more serious and needs treatment right away. Hospitalization is often needed. °HOME CARE INSTRUCTIONS °· Get as much rest as you can. Nap when the baby sleeps.   °· Exercise regularly. Some women find yoga and walking to be beneficial.   °· Eat a balanced and nourishing diet.   °· Do little things that you enjoy. Have a cup of tea, take a bubble bath, read your favorite magazine, or listen to your favorite music. °· Avoid alcohol.   °· Ask for help with household chores, cooking, grocery shopping, or running errands as needed. Do not try to do everything.   °· Talk to people close to you about how you are feeling. Get support from your partner, family members, friends, or other new moms. °· Try to stay positive in how you think. Think about the things you are grateful for.   °· Do not spend a lot of time alone.   °· Only take over-the-counter or prescription medicine as directed by your health care provider. °· Keep all your postpartum appointments.   °· Let your health care provider know if you have any concerns.   °SEEK MEDICAL CARE IF: °You are having a reaction to or problems with your medicine. °SEEK IMMEDIATE MEDICAL CARE IF: °· You have suicidal feelings.   °· You think you may harm the baby or someone else. °MAKE SURE YOU: °· Understand these instructions. °· Will watch your condition. °· Will get help right away if you are not doing well or get worse. °Document Released: 03/16/2004 Document Revised: 06/17/2013 Document Reviewed: 03/24/2013 °Exit[employer]® Patient Information ©2015 [employer]. This information is not intended to replace advice given to you by your health care provider. Make sure you discuss any questions you have with your health care provider. °Breastfeeding and Mastitis °Mastitis is inflammation of the breast tissue. It can occur in women who   are breastfeeding. This can make breastfeeding  painful. Mastitis will sometimes go away on its own. Your health care provider will help determine if treatment is needed. °CAUSES °Mastitis is often associated with a blocked milk (lactiferous) duct. This can happen when too much milk builds up in the breast. Causes of excess milk in the breast can include: °· Poor latch-on. If your baby is not latched onto the breast properly, she or he may not empty your breast completely while breastfeeding. °· Allowing too much time to pass between feedings. °· Wearing a bra or other clothing that is too tight. This puts extra pressure on the lactiferous ducts so milk does not flow through them as it should. °Mastitis can also be caused by a bacterial infection. Bacteria may enter the breast tissue through cuts or openings in the skin. In women who are breastfeeding, this may occur because of cracked or irritated skin. Cracks in the skin are often caused when your baby does not latch on properly to the breast. °SIGNS AND SYMPTOMS °· Swelling, redness, tenderness, and pain in an area of the breast. °· Swelling of the glands under the arm on the same side. °· Fever may or may not accompany mastitis. °If an infection is allowed to progress, a collection of pus (abscess) may develop. °DIAGNOSIS  °Your health care provider can usually diagnose mastitis based on your symptoms and a physical exam. Tests may be done to help confirm the diagnosis. These may include: °· Removal of pus from the breast by applying pressure to the area. This pus can be examined in the lab to determine which bacteria are present. If an abscess has developed, the fluid in the abscess can be removed with a needle. This can also be used to confirm the diagnosis and determine the bacteria present. In most cases, pus will not be present. °· Blood tests to determine if your body is fighting a bacterial infection. °· Mammogram or ultrasound tests to rule out other problems or diseases. °TREATMENT  °Mastitis that  occurs with breastfeeding will sometimes go away on its own. Your health care provider may choose to wait 24 hours after first seeing you to decide whether a prescription medicine is needed. If your symptoms are worse after 24 hours, your health care provider will likely prescribe an antibiotic medicine to treat the mastitis. He or she will determine which bacteria are most likely causing the infection and will then select an appropriate antibiotic medicine. This is sometimes changed based on the results of tests performed to identify the bacteria, or if there is no response to the antibiotic medicine selected. Antibiotic medicines are usually given by mouth. You may also be given medicine for pain. °HOME CARE INSTRUCTIONS °· Only take over-the-counter or prescription medicines for pain, fever, or discomfort as directed by your health care provider. °· If your health care provider prescribed an antibiotic medicine, take the medicine as directed. Make sure you finish it even if you start to feel better. °· Do not wear a tight or underwire bra. Wear a soft, supportive bra. °· Increase your fluid intake, especially if you have a fever. °· Continue to empty the breast. Your health care provider can tell you whether this milk is safe for your infant or needs to be thrown out. You may be told to stop nursing until your health care provider thinks it is safe for your baby. Use a breast pump if you are advised to stop nursing. °· Keep your nipples   clean and dry. °· Empty the first breast completely before going to the other breast. If your baby is not emptying your breasts completely for some reason, use a breast pump to empty your breasts. °· If you go back to work, pump your breasts while at work to stay in time with your nursing schedule. °· Avoid allowing your breasts to become overly filled with milk (engorged). °SEEK MEDICAL CARE IF: °· You have pus-like discharge from the breast. °· Your symptoms do not improve with  the treatment prescribed by your health care provider within 2 days. °SEEK IMMEDIATE MEDICAL CARE IF: °· Your pain and swelling are getting worse. °· You have pain that is not controlled with medicine. °· You have a red line extending from the breast toward your armpit. °· You have a fever or persistent symptoms for more than 2-3 days. °· You have a fever and your symptoms suddenly get worse. °MAKE SURE YOU:  °· Understand these instructions. °· Will watch your condition. °· Will get help right away if you are not doing well or get worse. °Document Released: 10/07/2004 Document Revised: 06/17/2013 Document Reviewed: 01/16/2013 °[employer]® Patient Information ©2015 [employer]. This information is not intended to replace advice given to you by your health care provider. Make sure you discuss any questions you have with your health care provider. °Breastfeeding °Deciding to breastfeed is one of the best choices you can make for you and your baby. A change in hormones during pregnancy causes your breast tissue to grow and increases the number and size of your milk ducts. These hormones also allow proteins, sugars, and fats from your blood supply to make breast milk in your milk-producing glands. Hormones prevent breast milk from being released before your baby is born as well as prompt milk flow after birth. Once breastfeeding has begun, thoughts of your baby, as well as his or her sucking or crying, can stimulate the release of milk from your milk-producing glands.  °BENEFITS OF BREASTFEEDING °For Your Baby °· Your first milk (colostrum) helps your baby's digestive system function better.   °· There are antibodies in your milk that help your baby fight off infections.   °· Your baby has a lower incidence of asthma, allergies, and sudden infant death syndrome.   °· The nutrients in breast milk are better for your baby than infant formulas and are designed uniquely for your baby's needs.   °· Breast milk improves your  baby's brain development.   °· Your baby is less likely to develop other conditions, such as childhood obesity, asthma, or type 2 diabetes mellitus.   °For You  °· Breastfeeding helps to create a very special bond between you and your baby.   °· Breastfeeding is convenient. Breast milk is always available at the correct temperature and costs nothing.   °· Breastfeeding helps to burn calories and helps you lose the weight gained during pregnancy.   °· Breastfeeding makes your uterus contract to its prepregnancy size faster and slows bleeding (lochia) after you give birth.   °· Breastfeeding helps to lower your risk of developing type 2 diabetes mellitus, osteoporosis, and breast or ovarian cancer later in life. °SIGNS THAT YOUR BABY IS HUNGRY °Early Signs of Hunger  °· Increased alertness or activity. °· Stretching. °· Movement of the head from side to side. °· Movement of the head and opening of the mouth when the corner of the mouth or cheek is stroked (rooting). °· Increased sucking sounds, smacking lips, cooing, sighing, or squeaking. °· Hand-to-mouth movements. °· Increased sucking of   fingers or hands. °Late Signs of Hunger °· Fussing. °· Intermittent crying. °Extreme Signs of Hunger °Signs of extreme hunger will require calming and consoling before your baby will be able to breastfeed successfully. Do not wait for the following signs of extreme hunger to occur before you initiate breastfeeding:   °· Restlessness. °· A loud, strong cry. °·  Screaming. °BREASTFEEDING BASICS °Breastfeeding Initiation °· Find a comfortable place to sit or lie down, with your neck and back well supported. °· Place a pillow or rolled up blanket under your baby to bring him or her to the level of your breast (if you are seated). Nursing pillows are specially designed to help support your arms and your baby while you breastfeed. °· Make sure that your baby's abdomen is facing your abdomen.   °· Gently massage your breast. With your  fingertips, massage from your chest wall toward your nipple in a circular motion. This encourages milk flow. You may need to continue this action during the feeding if your milk flows slowly. °· Support your breast with 4 fingers underneath and your thumb above your nipple. Make sure your fingers are well away from your nipple and your baby's mouth.   °· Stroke your baby's lips gently with your finger or nipple.   °· When your baby's mouth is open wide enough, quickly bring your baby to your breast, placing your entire nipple and as much of the colored area around your nipple (areola) as possible into your baby's mouth.   °¨ More areola should be visible above your baby's upper lip than below the lower lip.   °¨ Your baby's tongue should be between his or her lower gum and your breast.   °· Ensure that your baby's mouth is correctly positioned around your nipple (latched). Your baby's lips should create a seal on your breast and be turned out (everted). °· It is common for your baby to suck about 2-3 minutes in order to start the flow of breast milk. °Latching °Teaching your baby how to latch on to your breast properly is very important. An improper latch can cause nipple pain and decreased milk supply for you and poor weight gain in your baby. Also, if your baby is not latched onto your nipple properly, he or she may swallow some air during feeding. This can make your baby fussy. Burping your baby when you switch breasts during the feeding can help to get rid of the air. However, teaching your baby to latch on properly is still the best way to prevent fussiness from swallowing air while breastfeeding. °Signs that your baby has successfully latched on to your nipple:    °· Silent tugging or silent sucking, without causing you pain.   °· Swallowing heard between every 3-4 sucks.   °·  Muscle movement above and in front of his or her ears while sucking.   °Signs that your baby has not successfully latched on to  nipple:  °· Sucking sounds or smacking sounds from your baby while breastfeeding. °· Nipple pain. °If you think your baby has not latched on correctly, slip your finger into the corner of your baby's mouth to break the suction and place it between your baby's gums. Attempt breastfeeding initiation again. °Signs of Successful Breastfeeding °Signs from your baby:   °· A gradual decrease in the number of sucks or complete cessation of sucking.   °· Falling asleep.   °· Relaxation of his or her body.   °· Retention of a small amount of milk in his or her mouth.   °· Letting go   of your breast by himself or herself. °Signs from you: °· Breasts that have increased in firmness, weight, and size 1-3 hours after feeding.   °· Breasts that are softer immediately after breastfeeding. °· Increased milk volume, as well as a change in milk consistency and color by the fifth day of breastfeeding.   °· Nipples that are not sore, cracked, or bleeding. °Signs That Your Baby is Getting Enough Milk °· Wetting at least 3 diapers in a 24-hour period. The urine should be clear and pale yellow by age 5 days. °· At least 3 stools in a 24-hour period by age 5 days. The stool should be soft and yellow. °· At least 3 stools in a 24-hour period by age 7 days. The stool should be seedy and yellow. °· No loss of weight greater than 10% of birth weight during the first 3 days of age. °· Average weight gain of 4-7 ounces (113-198 g) per week after age 4 days. °· Consistent daily weight gain by age 5 days, without weight loss after the age of 2 weeks. °After a feeding, your baby may spit up a small amount. This is common. °BREASTFEEDING FREQUENCY AND DURATION °Frequent feeding will help you make more milk and can prevent sore nipples and breast engorgement. Breastfeed when you feel the need to reduce the fullness of your breasts or when your baby shows signs of hunger. This is called "breastfeeding on demand." Avoid introducing a pacifier to your  baby while you are working to establish breastfeeding (the first 4-6 weeks after your baby is born). After this time you may choose to use a pacifier. Research has shown that pacifier use during the first year of a baby's life decreases the risk of sudden infant death syndrome (SIDS). °Allow your baby to feed on each breast as long as he or she wants. Breastfeed until your baby is finished feeding. When your baby unlatches or falls asleep while feeding from the first breast, offer the second breast. Because newborns are often sleepy in the first few weeks of life, you may need to awaken your baby to get him or her to feed. °Breastfeeding times will vary from baby to baby. However, the following rules can serve as a guide to help you ensure that your baby is properly fed: °· Newborns (babies 4 weeks of age or younger) may breastfeed every 1-3 hours. °· Newborns should not go longer than 3 hours during the day or 5 hours during the night without breastfeeding. °· You should breastfeed your baby a minimum of 8 times in a 24-hour period until you begin to introduce solid foods to your baby at around 6 months of age. °BREAST MILK PUMPING °Pumping and storing breast milk allows you to ensure that your baby is exclusively fed your breast milk, even at times when you are unable to breastfeed. This is especially important if you are going back to work while you are still breastfeeding or when you are not able to be present during feedings. Your lactation consultant can give you guidelines on how long it is safe to store breast milk.  °A breast pump is a machine that allows you to pump milk from your breast into a sterile bottle. The pumped breast milk can then be stored in a refrigerator or freezer. Some breast pumps are operated by hand, while others use electricity. Ask your lactation consultant which type will work best for you. Breast pumps can be purchased, but some hospitals and breastfeeding support groups   lease  breast pumps on a monthly basis. A lactation consultant can teach you how to hand express breast milk, if you prefer not to use a pump.  °CARING FOR YOUR BREASTS WHILE YOU BREASTFEED °Nipples can become dry, cracked, and sore while breastfeeding. The following recommendations can help keep your breasts moisturized and healthy: °· Avoid using soap on your nipples.   °· Wear a supportive bra. Although not required, special nursing bras and tank tops are designed to allow access to your breasts for breastfeeding without taking off your entire bra or top. Avoid wearing underwire-style bras or extremely tight bras. °· Air dry your nipples for 3-4 minutes after each feeding.   °· Use only cotton bra pads to absorb leaked breast milk. Leaking of breast milk between feedings is normal.   °· Use lanolin on your nipples after breastfeeding. Lanolin helps to maintain your skin's normal moisture barrier. If you use pure lanolin, you do not need to wash it off before feeding your baby again. Pure lanolin is not toxic to your baby. You may also hand express a few drops of breast milk and gently massage that milk into your nipples and allow the milk to air dry. °In the first few weeks after giving birth, some women experience extremely full breasts (engorgement). Engorgement can make your breasts feel heavy, warm, and tender to the touch. Engorgement peaks within 3-5 days after you give birth. The following recommendations can help ease engorgement: °· Completely empty your breasts while breastfeeding or pumping. You may want to start by applying warm, moist heat (in the shower or with warm water-soaked hand towels) just before feeding or pumping. This increases circulation and helps the milk flow. If your baby does not completely empty your breasts while breastfeeding, pump any extra milk after he or she is finished. °· Wear a snug bra (nursing or regular) or tank top for 1-2 days to signal your body to slightly decrease milk  production. °· Apply ice packs to your breasts, unless this is too uncomfortable for you. °· Make sure that your baby is latched on and positioned properly while breastfeeding. °If engorgement persists after 48 hours of following these recommendations, contact your health care provider or a lactation consultant. °OVERALL HEALTH CARE RECOMMENDATIONS WHILE BREASTFEEDING °· Eat healthy foods. Alternate between meals and snacks, eating 3 of each per day. Because what you eat affects your breast milk, some of the foods may make your baby more irritable than usual. Avoid eating these foods if you are sure that they are negatively affecting your baby. °· Drink milk, fruit juice, and water to satisfy your thirst (about 10 glasses a day).   °· Rest often, relax, and continue to take your prenatal vitamins to prevent fatigue, stress, and anemia. °· Continue breast self-awareness checks. °· Avoid chewing and smoking tobacco. °· Avoid alcohol and drug use. °Some medicines that may be harmful to your baby can pass through breast milk. It is important to ask your health care provider before taking any medicine, including all over-the-counter and prescription medicine as well as vitamin and herbal supplements. °It is possible to become pregnant while breastfeeding. If birth control is desired, ask your health care provider about options that will be safe for your baby. °SEEK MEDICAL CARE IF:  °· You feel like you want to stop breastfeeding or have become frustrated with breastfeeding. °· You have painful breasts or nipples. °· Your nipples are cracked or bleeding. °· Your breasts are red, tender, or warm. °· You have   a swollen area on either breast. °· You have a fever or chills. °· You have nausea or vomiting. °· You have drainage other than breast milk from your nipples. °· Your breasts do not become full before feedings by the fifth day after you give birth. °· You feel sad and depressed. °· Your baby is too sleepy to eat  well. °· Your baby is having trouble sleeping.   °· Your baby is wetting less than 3 diapers in a 24-hour period. °· Your baby has less than 3 stools in a 24-hour period. °· Your baby's skin or the white part of his or her eyes becomes yellow.   °· Your baby is not gaining weight by 5 days of age. °SEEK IMMEDIATE MEDICAL CARE IF:  °· Your baby is overly tired (lethargic) and does not want to wake up and feed. °· Your baby develops an unexplained fever. °Document Released: 06/12/2005 Document Revised: 06/17/2013 Document Reviewed: 12/04/2012 °[employer]® Patient Information ©2015 [employer]. This information is not intended to replace advice given to you by your health care provider. Make sure you discuss any questions you have with your health care provider. ° °

## 2014-12-02 ENCOUNTER — Emergency Department (HOSPITAL_COMMUNITY)
Admission: EM | Admit: 2014-12-02 | Discharge: 2014-12-02 | Disposition: A | Discharge status: Home / Self Care | Payer: BC Managed Care – PPO | Source: Home / Self Care | Attending: Emergency Medicine | Admitting: Emergency Medicine

## 2014-12-02 ENCOUNTER — Encounter (HOSPITAL_COMMUNITY): Payer: Self-pay | Admitting: Emergency Medicine

## 2014-12-02 DIAGNOSIS — R21 Rash and other nonspecific skin eruption: Secondary | ICD-10-CM

## 2014-12-02 MED ORDER — HYDROXYZINE HCL 25 MG PO TABS: 25.0000 mg | ORAL_TABLET | Freq: Four times a day (QID) | ORAL | Status: DC | PRN

## 2014-12-02 MED ORDER — PERMETHRIN 5 % EX CREA: TOPICAL_CREAM | CUTANEOUS | Status: DC

## 2014-12-02 NOTE — ED Notes (Signed)
C/o  Rash on hands and feet.  On set around 8 p.m last night.  Denies any changes in soaps/detergents. No new meds.

## 2014-12-02 NOTE — ED Provider Notes (Signed)
CSN: [id number]     Arrival date & time 12/02/14  1514 History   First MD Initiated Contact with Patient 12/02/14 1537     Chief Complaint  Patient presents with  . Rash   (Consider location/radiation/quality/duration/timing/severity/associated sxs/prior Treatment) HPI  She is a 34 year old woman here for evaluation of rash. She states she noticed it last night around 8 PM. She describes small red bumps on her hands and feet. It is very itchy. Her son was sick with a fever and rash last week. She denies any fever, nausea, vomiting. No new detergents or soaps.  Past Medical History  Diagnosis Date  . Medical history non-contributory   . Anemia   . Sickle cell trait    Past Surgical History  Procedure Laterality Date  . Cyst removal on left wrist    . Induced abortion    . Ganglion cyst excision      x2, left wrist  . Cesarean section N/A 05/23/2014    Procedure: CESAREAN SECTION;  Surgeon: Genia Del, MD;  Location: [facility];  Service: Obstetrics;  Laterality: N/A;   Family History  Problem Relation Age of Onset  . Hearing loss Neg Hx    History  Substance Use Topics  . Smoking status: Never Smoker   . Smokeless tobacco: Never Used  . Alcohol Use: No   OB History    Gravida Para Term Preterm AB TAB SAB Ectopic Multiple Living   0  0 1     Review of Systems As in history of present illness Allergies  Review of patient's allergies indicates no known allergies.  Home Medications   Prior to Admission medications   Medication Sig Start Date End Date Taking? Authorizing Provider  ferrous sulfate 325 (65 FE) MG tablet Take 1 tablet (325 mg total) by mouth 2 (two) times daily with a meal. 05/26/14   Raelyn Mora, CNM  hydrOXYzine (ATARAX/VISTARIL) 25 MG tablet Take 1 tablet (25 mg total) by mouth every 6 (six) hours as needed for itching. 12/02/14   Charm Rings, MD  ibuprofen (ADVIL,MOTRIN) 600 MG tablet Take 1 tablet (600 mg total) by mouth every 6 (six)  hours. 05/26/14   Raelyn Mora, CNM  oxyCODONE-acetaminophen (PERCOCET/ROXICET) 5-325 MG per tablet Take 1 tablet by mouth every 4 (four) hours as needed (for pain scale less than 7). 05/26/14   Raelyn Mora, CNM  permethrin (ELIMITE) 5 % cream Apply from neck down.  Leave in place 12 hours, then rinse off.  Repeat in 1 week if needed. 12/02/14   Charm Rings, MD  Prenatal Vit-Fe Fumarate-FA (PRENATAL MULTIVITAMIN) TABS tablet Take 1 tablet by mouth daily at 12 noon.    Historical Provider, MD   BP 131/99 mmHg  Pulse 108  Temp(Src) 97.4 F (36.3 C) (Oral)  Resp 16  SpO2 100%  LMP 11/16/2014  Breastfeeding? Unknown Physical Exam  Constitutional: She is oriented to person, place, and time. She appears well-developed and well-nourished. No distress.  Cardiovascular: Normal rate.   Pulmonary/Chest: Effort normal.  Neurological: She is alert and oriented to person, place, and time.  Skin:  1-2 mm erythematous papules on her bilateral hands. They are in the inter-web spaces as well as the palm. She has several small spots on her feet as well.    ED Course  Procedures (including critical care time) Labs Review Labs Reviewed - No data to display  Imaging Review No results found.   MDM  1. Rash    Differential includes hand-foot-and-mouth disease as well as scabies. We'll treat with permethrin cream and hydroxyzine. Tylenol or Motrin as needed. Follow-up as needed.    Charm RingsErin J Ransom Nickson, MD 12/02/14 1600

## 2014-12-02 NOTE — Discharge Instructions (Signed)
Your rash is either caused by a mite or a virus. Use the permethrin cream as directed to kill any mites. Wash your towels, bedding, and clothing in hot water and dry on high heat for at least 15 minutes. Take hydroxyzine every 6 hours as needed for itching. Follow-up as needed.

## 2015-06-10 ENCOUNTER — Encounter (HOSPITAL_COMMUNITY): Payer: Self-pay | Admitting: Emergency Medicine

## 2015-06-10 ENCOUNTER — Emergency Department (INDEPENDENT_AMBULATORY_CARE_PROVIDER_SITE_OTHER): Payer: Worker's Compensation

## 2015-06-10 ENCOUNTER — Emergency Department (INDEPENDENT_AMBULATORY_CARE_PROVIDER_SITE_OTHER)
Admission: EM | Admit: 2015-06-10 | Discharge: 2015-06-10 | Disposition: A | Discharge status: Home / Self Care | Payer: Worker's Compensation | Source: Home / Self Care | Attending: Family Medicine | Admitting: Family Medicine

## 2015-06-10 DIAGNOSIS — S6000XA Contusion of unspecified finger without damage to nail, initial encounter: Secondary | ICD-10-CM

## 2015-06-10 NOTE — ED Provider Notes (Signed)
CSN: [id number]     Arrival date & time 06/10/15  1616 History   First MD Initiated Contact with Patient 06/10/15 1852     Chief Complaint  Patient presents with  . Finger Injury   (Consider location/radiation/quality/duration/timing/severity/associated sxs/prior Treatment) HPI Comments: 34 year old female was pushing a chair next to a table when her left long finger got caught between the 2 objects producing a crushing type injury. She is complaining of pain to the distal aspect of the distal phalanx. The artificial nail she is wearing was cracked and there is ecchymosis and mild swelling to the pulp of the involved digit.   Past Medical History  Diagnosis Date  . Medical history non-contributory   . Anemia   . Sickle cell trait [facility])    Past Surgical History  Procedure Laterality Date  . Cyst removal on left wrist    . Induced abortion    . Ganglion cyst excision      x2, left wrist  . Cesarean section N/A 05/23/2014    Procedure: CESAREAN SECTION;  Surgeon: Genia Del, MD;  Location: [facility];  Service: Obstetrics;  Laterality: N/A;   Family History  Problem Relation Age of Onset  . Hearing loss Neg Hx    Social History  Substance Use Topics  . Smoking status: Never Smoker   . Smokeless tobacco: Never Used  . Alcohol Use: Yes   OB History    Gravida Para Term Preterm AB TAB SAB Ectopic Multiple Living   0  0 1     Review of Systems  Constitutional: Negative for fever, activity change and fatigue.  HENT: Negative.   Musculoskeletal: Negative for myalgias, back pain and gait problem.  Skin: Positive for color change.       To affected digit  Neurological: Negative.     Allergies  Review of patient's allergies indicates no known allergies.  Home Medications   Prior to Admission medications   Medication Sig Start Date End Date Taking? Authorizing Provider  ferrous sulfate 325 (65 FE) MG tablet Take 1 tablet (325 mg total) by mouth 2 (two) times  daily with a meal. 05/26/14   Raelyn Mora, CNM  hydrOXYzine (ATARAX/VISTARIL) 25 MG tablet Take 1 tablet (25 mg total) by mouth every 6 (six) hours as needed for itching. 12/02/14   Charm Rings, MD  ibuprofen (ADVIL,MOTRIN) 600 MG tablet Take 1 tablet (600 mg total) by mouth every 6 (six) hours. 05/26/14   Raelyn Mora, CNM  oxyCODONE-acetaminophen (PERCOCET/ROXICET) 5-325 MG per tablet Take 1 tablet by mouth every 4 (four) hours as needed (for pain scale less than 7). 05/26/14   Raelyn Mora, CNM  permethrin (ELIMITE) 5 % cream Apply from neck down.  Leave in place 12 hours, then rinse off.  Repeat in 1 week if needed. 12/02/14   Charm Rings, MD  Prenatal Vit-Fe Fumarate-FA (PRENATAL MULTIVITAMIN) TABS tablet Take 1 tablet by mouth daily at 12 noon.    Historical Provider, MD   Meds Ordered and Administered this Visit  Medications - No data to display  BP 119/82 mmHg  Pulse 74  Temp(Src) 98.6 F (37 C) (Oral)  Resp 16  SpO2 100%  LMP 05/19/2015 No data found.   Physical Exam  Constitutional: She is oriented to person, place, and time. She appears well-developed and well-nourished. No distress.  Neck: Normal range of motion. Neck supple.  Cardiovascular: Normal rate.   Pulmonary/Chest: Effort normal. No respiratory  distress.  Musculoskeletal:  Left long finger with normal range of motion. No tenderness or swelling to the tenderness involves the nail and pulp of the right index finger. Flexion and extension against resistance is intact. No bleeding. Skin is intact.  Neurological: She is alert and oriented to person, place, and time. No cranial nerve deficit. She exhibits normal muscle tone.  Skin: Skin is warm and dry.  Nursing note and vitals reviewed.   ED Course  Procedures (including critical care time)  Labs Review Labs Reviewed - No data to display  Imaging Review Dg Finger Middle Left  06/10/2015  CLINICAL DATA:  Acute left middle finger pain after crush injury today.  EXAM: LEFT MIDDLE FINGER 2+V COMPARISON:  None. FINDINGS: There is no evidence of fracture or dislocation. There is no evidence of arthropathy or other focal bone abnormality. Soft tissues are unremarkable. IMPRESSION: Normal left middle finger. Electronically Signed   By: Lupita RaiderJames  Green Jr, M.D.   On: 06/10/2015 19:25     Visual Acuity Review  Right Eye Distance:   Left Eye Distance:   Bilateral Distance:    Right Eye Near:   Left Eye Near:    Bilateral Near:         MDM   1. Finger contusion, initial encounter    Fold over finger splint Ice, elevation Ibuprofen q 6h and tylenol 650 q 4-6h prn.   Hayden Rasmussenavid Rayelynn Loyal, NP 06/10/15 [phone]

## 2015-06-10 NOTE — ED Notes (Signed)
Patient left treatment room after [facility] - Melody mabe, np returned paper to her.

## 2015-06-10 NOTE — ED Notes (Signed)
Left middle finger injury.  Patient reports injurying her finger by sliding a chair up to the table, mashed middle finger tip.  Visible bruising.  Patient reports her nail is split from this injury.  Patient has gel polish on nails.  Patient notified supervisor and reports this is a workers comp injury

## 2015-06-10 NOTE — ED Notes (Signed)
At discharge patient mentioned workers comp paperwork.  Will present to [employer]id mabe, np

## 2015-06-10 NOTE — Discharge Instructions (Signed)

## 2016-03-29 ENCOUNTER — Emergency Department (HOSPITAL_COMMUNITY)
Admission: EM | Admit: 2016-03-29 | Discharge: 2016-03-29 | Disposition: A | Discharge status: Home / Self Care | Payer: BC Managed Care – PPO | Attending: Emergency Medicine | Admitting: Emergency Medicine

## 2016-03-29 ENCOUNTER — Encounter (HOSPITAL_COMMUNITY): Payer: Self-pay | Admitting: Emergency Medicine

## 2016-03-29 DIAGNOSIS — R109 Unspecified abdominal pain: Secondary | ICD-10-CM | POA: Diagnosis present

## 2016-03-29 DIAGNOSIS — Y939 Activity, unspecified: Secondary | ICD-10-CM | POA: Insufficient documentation

## 2016-03-29 DIAGNOSIS — M546 Pain in thoracic spine: Secondary | ICD-10-CM

## 2016-03-29 DIAGNOSIS — Y92410 Unspecified street and highway as the place of occurrence of the external cause: Secondary | ICD-10-CM | POA: Insufficient documentation

## 2016-03-29 DIAGNOSIS — Y999 Unspecified external cause status: Secondary | ICD-10-CM | POA: Insufficient documentation

## 2016-03-29 MED ORDER — METHOCARBAMOL 500 MG PO TABS: 500.0000 mg | ORAL_TABLET | Freq: Two times a day (BID) | ORAL | 0 refills | Status: DC

## 2016-03-29 MED ORDER — IBUPROFEN 600 MG PO TABS: 600.0000 mg | ORAL_TABLET | Freq: Four times a day (QID) | ORAL | 0 refills | Status: DC | PRN
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
## 2016-03-29 NOTE — ED Notes (Signed)
PT DISCHARGED. INSTRUCTIONS AND PRESCRIPTIONS GIVEN. AAOX4. PT IN NO APPARENT DISTRESS. THE OPPORTUNITY TO ASK QUESTIONS WAS PROVIDED. 

## 2016-03-29 NOTE — ED Notes (Signed)
Patient is complaining of left lower back pain

## 2016-03-29 NOTE — Discharge Instructions (Signed)
Take your medications as prescribed as an for pain relief. I recommended resting and applying ice and/or heat to affected areas for 15 minutes 3-4 times daily. Follow-up with your family doctor the next week if her symptoms have not improved. Please return to the Emergency Department if symptoms worsen or new onset of fever, numbness, tingling, groin anesthesia, loss of bowel or bladder, weakness, abdominal pain, urinary symptoms, chest pain, difficulty breathing.
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
## 2016-08-29 IMAGING — US US FETAL BPP W/O NONSTRESS
1 series · 13 of 15 positions shown · non-contrast
Comparison: none

[Series 1: us fetal bpp w/o nonstress · non-contrast · 15 acquisitions, 13 frames shown]
[im 1/15]
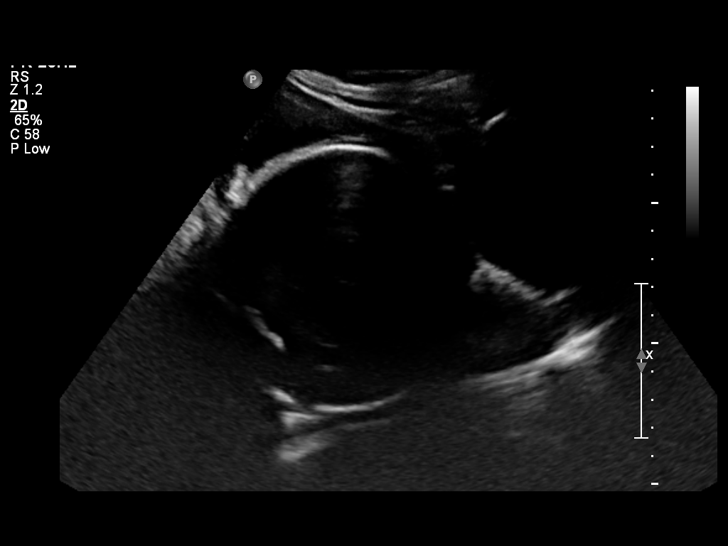
[im 2/15]
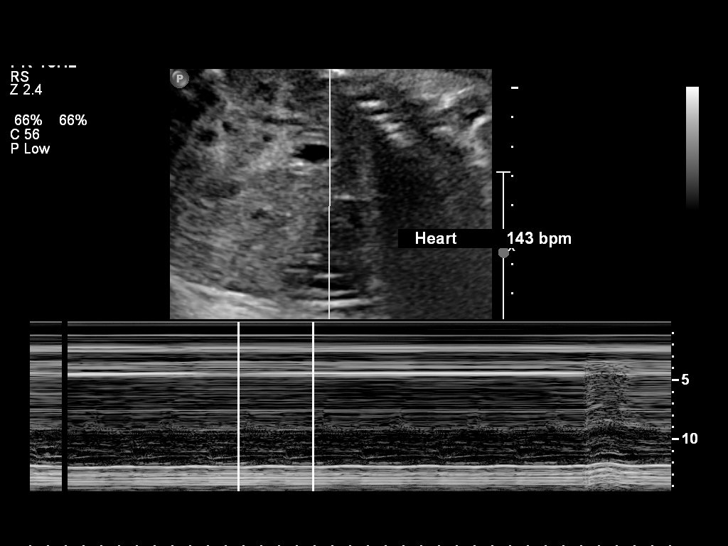
[im 3/15]
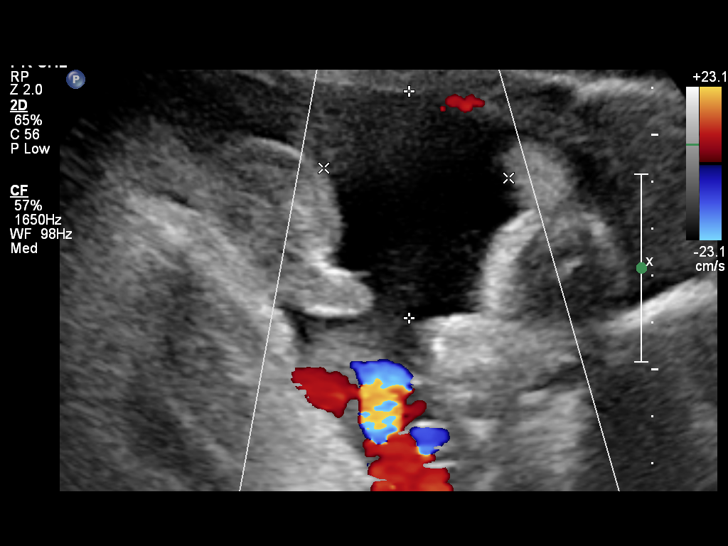
[im 5/15]
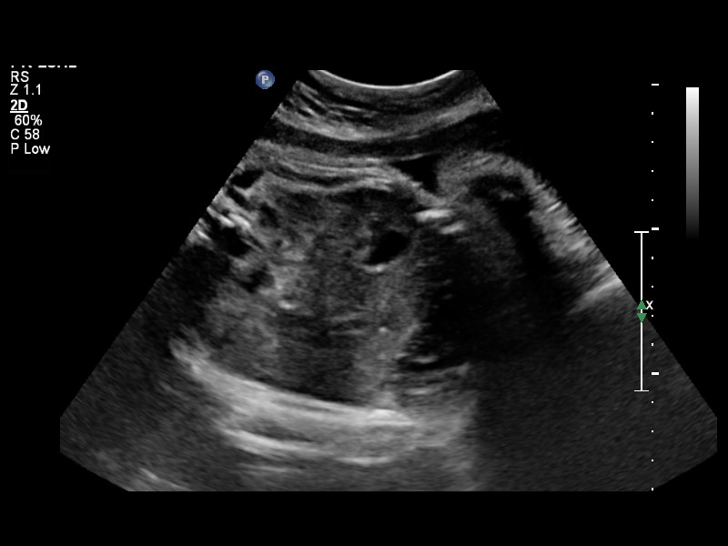
[im 6/15]
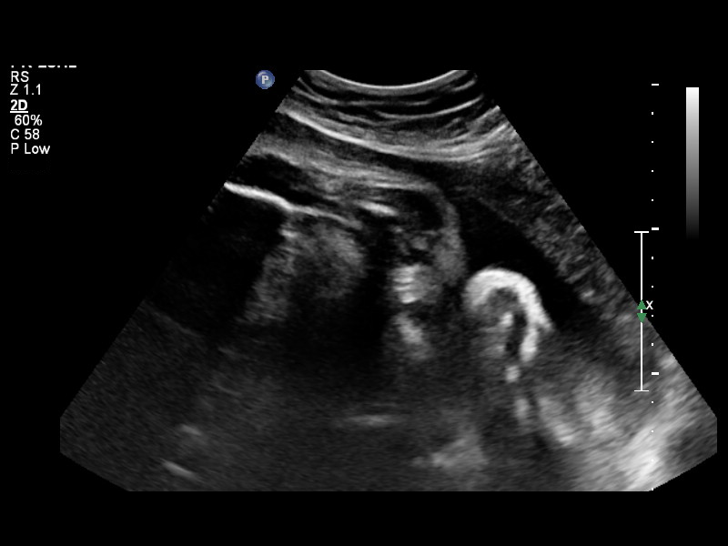
[im 7/15]
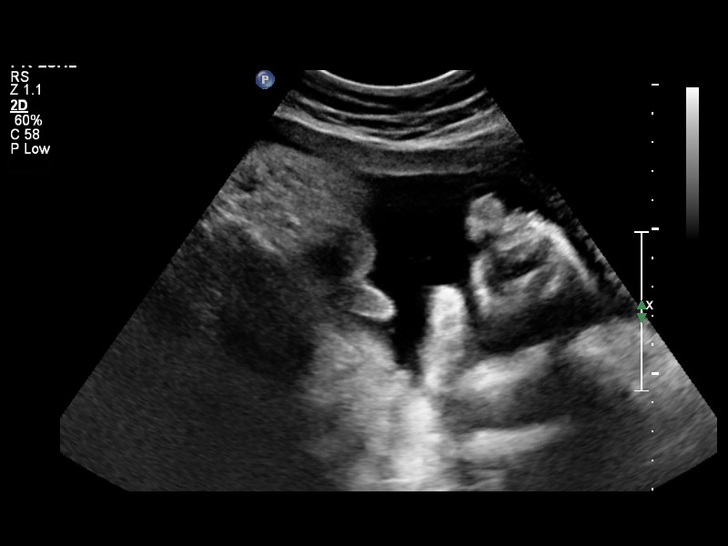
[im 8/15]
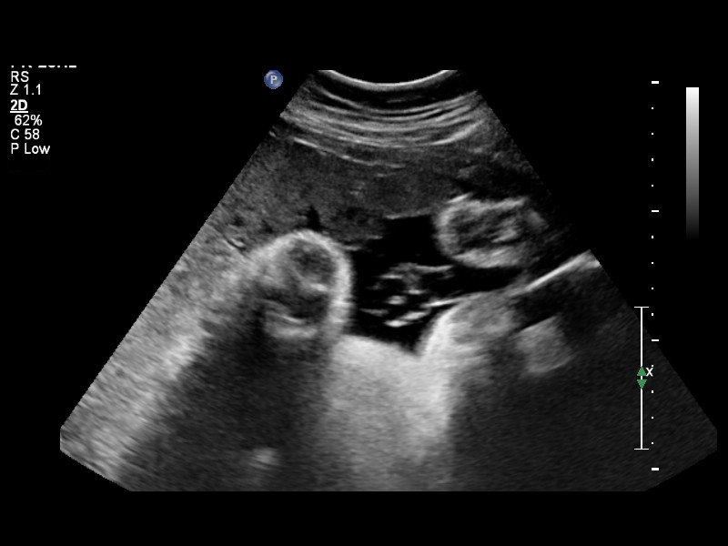
[im 9/15]
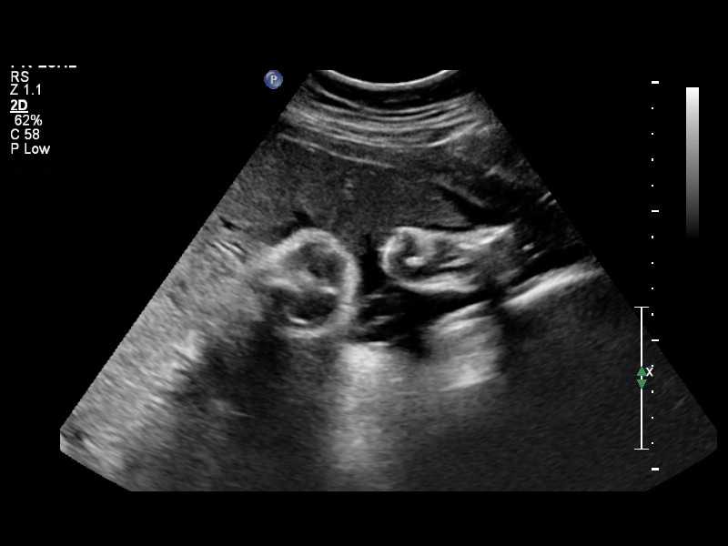
[im 10/15]
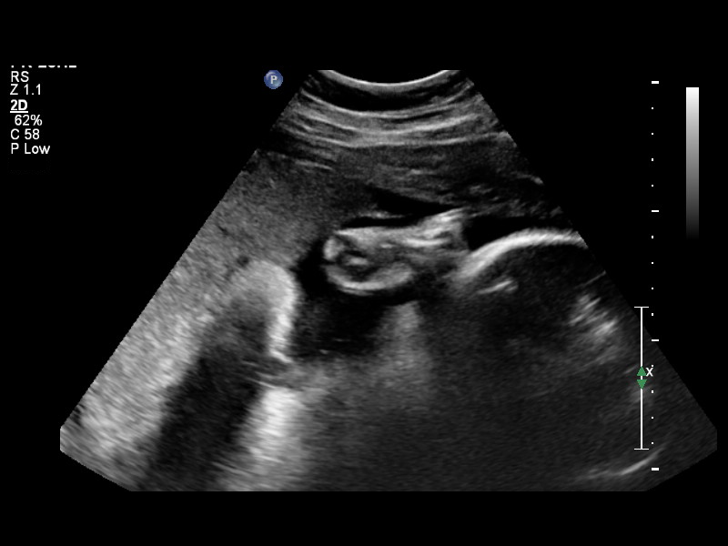
[im 11/15]
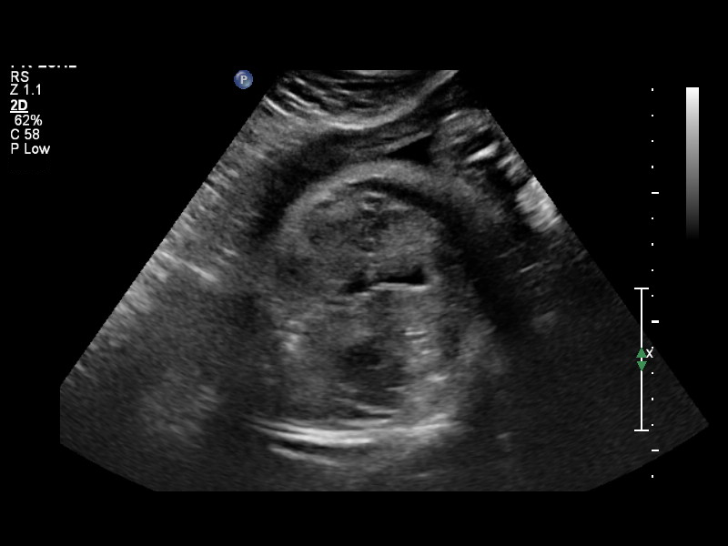
[im 13/15]
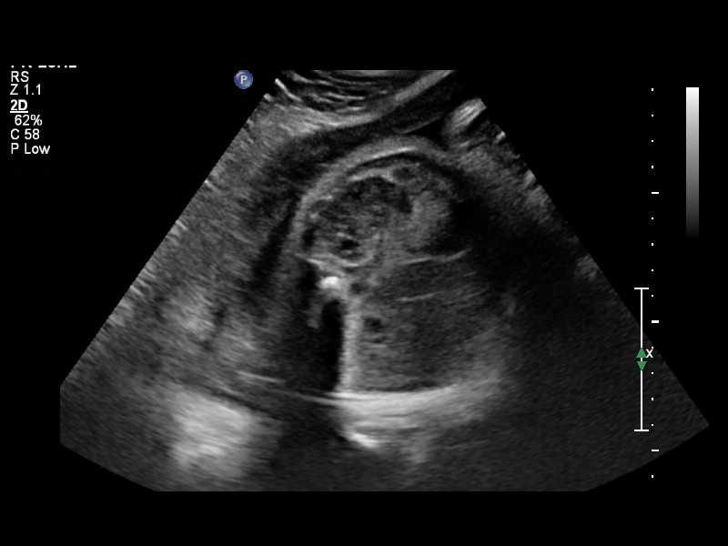
[im 14/15]
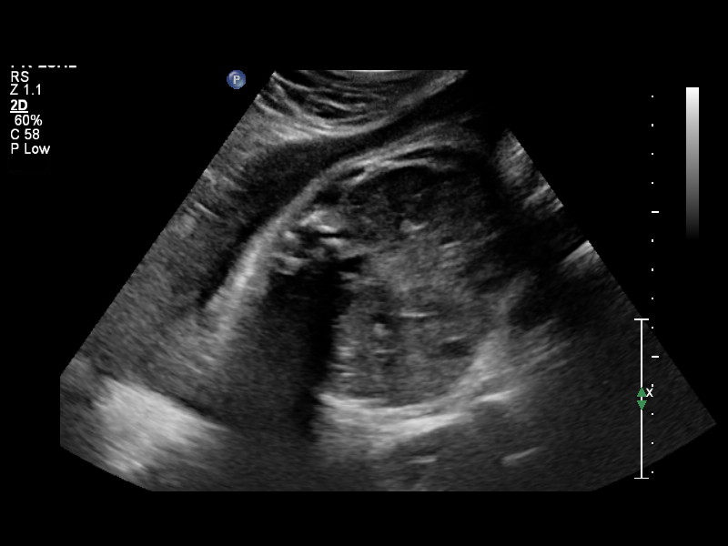
[im 15/15]
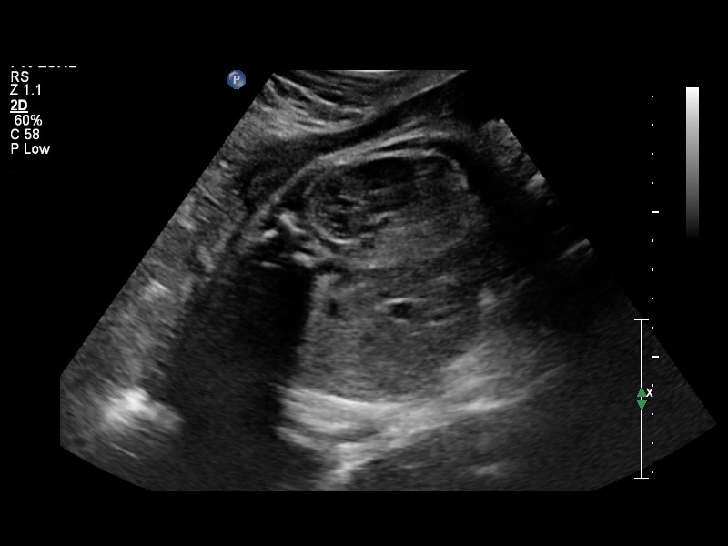

[13 of 15 positions shown; findings below may reference images not displayed]

OBSTETRICS REPORT
                      (Signed Final 04/16/2014 [DATE])

Service(s) Provided

Indications

 32 weeks gestation of pregnancy
 Decreased fetal movements, third trimester,
 unspecified
Fetal Evaluation

 Num Of Fetuses:    1
 Fetal Heart Rate:  143                          bpm
 Cardiac Activity:  Observed
 Presentation:      Cephalic

 Comment:    Breathing noted intermittently, but not sustained. BPP [DATE]
             in 30 minutes.

 Amniotic Fluid
 AFI FV:      Subjectively within normal limits
                                             Larg Pckt:    4.82  cm
Biophysical Evaluation

 Amniotic F.V:   Pocket => 2 cm two         F. Tone:        Observed
                 planes
 F. Movement:    Observed                   Score:          [DATE]
 F. Breathing:   Not Observed
Gestational Age

 LMP:           32w 6d        Date:  08/28/13                 EDD:   06/04/14
 Best:          32w 6d     Det. By:  LMP  (08/28/13)          EDD:   06/04/14
Cervix Uterus Adnexa

 Cervix:       Not visualized (advanced GA >33wks)
Impression

 SIUP at 32+6 weeks
 Cephalic presentation
 Normal amniotic fluid volume
 BPP [DATE] (-2 for BM)
Recommendations

 Correlate with clinically scenario
 Repeat BPP within 24 hours if BPP [DATE]

 questions or concerns.

## 2019-06-26 ENCOUNTER — Other Ambulatory Visit: Payer: Self-pay

## 2019-06-26 DIAGNOSIS — Z20822 Contact with and (suspected) exposure to covid-19: Secondary | ICD-10-CM

## 2019-06-28 LAB — NOVEL CORONAVIRUS, NAA: SARS-CoV-2, NAA: NOT DETECTED

## 2019-08-11 ENCOUNTER — Ambulatory Visit: Payer: BC Managed Care – PPO | Attending: Internal Medicine

## 2019-08-11 DIAGNOSIS — Z20822 Contact with and (suspected) exposure to covid-19: Secondary | ICD-10-CM

## 2019-08-12 LAB — NOVEL CORONAVIRUS, NAA: SARS-CoV-2, NAA: NOT DETECTED

## 2020-05-23 ENCOUNTER — Other Ambulatory Visit: Payer: Self-pay

## 2020-05-23 ENCOUNTER — Emergency Department (HOSPITAL_BASED_OUTPATIENT_CLINIC_OR_DEPARTMENT_OTHER)
Admission: EM | Admit: 2020-05-23 | Discharge: 2020-05-24 | Disposition: A | Discharge status: Home / Self Care | Payer: BC Managed Care – PPO | Attending: Emergency Medicine | Admitting: Emergency Medicine

## 2020-05-23 ENCOUNTER — Encounter (HOSPITAL_BASED_OUTPATIENT_CLINIC_OR_DEPARTMENT_OTHER): Payer: Self-pay | Admitting: Emergency Medicine

## 2020-05-23 ENCOUNTER — Emergency Department (HOSPITAL_BASED_OUTPATIENT_CLINIC_OR_DEPARTMENT_OTHER): Payer: BC Managed Care – PPO

## 2020-05-23 DIAGNOSIS — J069 Acute upper respiratory infection, unspecified: Secondary | ICD-10-CM | POA: Diagnosis not present

## 2020-05-23 DIAGNOSIS — R0602 Shortness of breath: Secondary | ICD-10-CM | POA: Diagnosis present

## 2020-05-23 DIAGNOSIS — Z20822 Contact with and (suspected) exposure to covid-19: Secondary | ICD-10-CM | POA: Insufficient documentation

## 2020-05-23 DIAGNOSIS — R059 Cough, unspecified: Secondary | ICD-10-CM

## 2020-05-23 LAB — RESP PANEL BY RT-PCR (FLU A&B, COVID) ARPGX2
Influenza A by PCR: NEGATIVE
Influenza B by PCR: NEGATIVE
SARS Coronavirus 2 by RT PCR: NEGATIVE

## 2020-05-23 MED ORDER — IBUPROFEN 800 MG PO TABS
800.0000 mg | ORAL_TABLET | Freq: Once | ORAL | Status: AC
  Administered 2020-05-23: 800 mg via ORAL

## 2020-05-23 MED ORDER — IBUPROFEN 800 MG PO TABS
ORAL_TABLET | ORAL | Status: AC
  Filled 2020-05-23: qty 1

## 2020-05-23 NOTE — ED Triage Notes (Signed)
Reports persistent cough for the last 48 hours.  Reports her lungs hurt when she coughs and has gotten consistently worse today.  No relief with otc meds.

## 2020-05-23 NOTE — ED Provider Notes (Signed)
[facility] EMERGENCY DEPARTMENT Provider Note   CSN: [id number] Arrival date & time: 05/23/20  2116     History Chief Complaint  Patient presents with  . Shortness of Breath    Melody Wells is a 39 y.o. female.  The history is provided by the patient. No language interpreter was used.  Shortness of Breath Severity:  Moderate Onset quality:  Gradual Duration:  2 days Timing:  Constant Progression:  Worsening Context: URI   Relieved by:  Nothing Worsened by:  Coughing Ineffective treatments:  None tried Associated symptoms: cough   Risk factors: no hx of PE/DVT        Past Medical History:  Diagnosis Date  . Anemia   . Medical history non-contributory   . Sickle cell trait [facility])     Patient Active Problem List   Diagnosis Date Noted  . Active labor 05/23/2014  . Postoperative state 05/23/2014  . Postpartum care following cesarean delivery (11/28) 05/23/2014    Past Surgical History:  Procedure Laterality Date  . CESAREAN SECTION N/A 05/23/2014   Procedure: CESAREAN SECTION;  Surgeon: Genia Del, MD;  Location: [facility] ORS;  Service: Obstetrics;  Laterality: N/A;  . cyst removal on left wrist    . GANGLION CYST EXCISION     x2, left wrist  . INDUCED ABORTION       OB History    Gravida  2   Para  1   Term  1   Preterm      AB  1   Living  1     SAB  0   TAB  1   Ectopic      Multiple  0   Live Births  1           Family History  Problem Relation Age of Onset  . Hearing loss Neg Hx     Social History   Tobacco Use  . Smoking status: Never Smoker  . Smokeless tobacco: Never Used  Substance Use Topics  . Alcohol use: Yes  . Drug use: No    Home Medications Prior to Admission medications   Medication Sig Start Date End Date Taking? Authorizing Provider  ferrous sulfate 325 (65 FE) MG tablet Take 1 tablet (325 mg total) by mouth 2 (two) times daily with a meal. 05/26/14   Raelyn Mora, CNM  hydrOXYzine  (ATARAX/VISTARIL) 25 MG tablet Take 1 tablet (25 mg total) by mouth every 6 (six) hours as needed for itching. 12/02/14   Charm Rings, MD  ibuprofen (ADVIL,MOTRIN) 600 MG tablet Take 1 tablet (600 mg total) by mouth every 6 (six) hours as needed. 03/29/16   Barrett Henle, PA-C  methocarbamol (ROBAXIN) 500 MG tablet Take 1 tablet (500 mg total) by mouth 2 (two) times daily. 03/29/16   Barrett Henle, PA-C  oxyCODONE-acetaminophen (PERCOCET/ROXICET) 5-325 MG per tablet Take 1 tablet by mouth every 4 (four) hours as needed (for pain scale less than 7). 05/26/14   Raelyn Mora, CNM  permethrin (ELIMITE) 5 % cream Apply from neck down.  Leave in place 12 hours, then rinse off.  Repeat in 1 week if needed. 12/02/14   Charm Rings, MD  Prenatal Vit-Fe Fumarate-FA (PRENATAL MULTIVITAMIN) TABS tablet Take 1 tablet by mouth daily at 12 noon.    [provider]    Allergies    Patient has no known allergies.  Review of Systems   Review of Systems  Respiratory: Positive for cough  and shortness of breath.   All other systems reviewed and are negative.   Physical Exam Updated Vital Signs BP (!) 140/97   Pulse 85   Temp 98.6 F (37 C) (Oral)   Resp (!) 24   Ht 5\' 5"  (1.651 m)   Wt 90.7 kg   SpO2 99%   BMI 33.28 kg/m   Physical Exam Vitals and nursing note reviewed.  Constitutional:      Appearance: She is well-developed.  HENT:     Head: Normocephalic.  Cardiovascular:     Rate and Rhythm: Normal rate.  Pulmonary:     Effort: Pulmonary effort is normal.     Breath sounds: No decreased breath sounds.  Chest:     Chest wall: No mass.  Abdominal:     General: There is no distension.  Musculoskeletal:        General: Normal range of motion.     Cervical back: Normal range of motion.  Skin:    General: Skin is warm.  Neurological:     General: No focal deficit present.     Mental Status: She is alert and oriented to person, place, and time.  Psychiatric:         Mood and Affect: Mood normal.     ED Results / Procedures / Treatments   Labs (all labs ordered are listed, but only abnormal results are displayed) Labs Reviewed  RESP PANEL BY RT-PCR (FLU A&B, COVID) [id number]    EKG None  Radiology DG Chest 2 View  Result Date: 05/23/2020 CLINICAL DATA:  Shortness of breath. EXAM: CHEST - 2 VIEW COMPARISON:  None. FINDINGS: The heart size and mediastinal contours are within normal limits. No focal consolidation. No pulmonary edema. No pleural effusion. No pneumothorax. No acute osseous abnormality. IMPRESSION: No active cardiopulmonary disease. Electronically Signed   By: 05/25/2020 M.D.   On: 05/23/2020 21:58    Procedures Procedures (including critical care time)  Medications Ordered in ED Medications - No data to display  ED Course  I have reviewed the triage vital signs and the nursing notes.  Pertinent labs & imaging results that were available during my care of the patient were reviewed by me and considered in my medical decision making (see chart for details).    MDM Rules/Calculators/A&P                          MDM:  O2 sats 99%  Chest xray  No pneumonia, covid and influenza negative.  Pt advised ibuprofen, otc cough meds.  Follow up with primary care for recheck.  Final Clinical Impression(s) / ED Diagnoses Final diagnoses:  Cough  Viral URI    Rx / DC Orders ED Discharge Orders    None    An After Visit Summary was printed and given to the patient.    05/25/2020 05/23/20 2327    [id number], MD 05/23/20 [phone]

## 2020-07-14 ENCOUNTER — Encounter (HOSPITAL_COMMUNITY): Payer: Self-pay | Admitting: Emergency Medicine

## 2020-07-14 ENCOUNTER — Emergency Department (HOSPITAL_COMMUNITY)
Admission: EM | Admit: 2020-07-14 | Discharge: 2020-07-15 | Disposition: A | Discharge status: Home / Self Care | Payer: BC Managed Care – PPO | Attending: Emergency Medicine | Admitting: Emergency Medicine

## 2020-07-14 DIAGNOSIS — Z79899 Other long term (current) drug therapy: Secondary | ICD-10-CM | POA: Diagnosis not present

## 2020-07-14 DIAGNOSIS — Y92219 Unspecified school as the place of occurrence of the external cause: Secondary | ICD-10-CM | POA: Insufficient documentation

## 2020-07-14 DIAGNOSIS — Z87898 Personal history of other specified conditions: Secondary | ICD-10-CM

## 2020-07-14 DIAGNOSIS — W228XXA Striking against or struck by other objects, initial encounter: Secondary | ICD-10-CM | POA: Diagnosis not present

## 2020-07-14 DIAGNOSIS — I10 Essential (primary) hypertension: Secondary | ICD-10-CM | POA: Diagnosis not present

## 2020-07-14 DIAGNOSIS — R55 Syncope and collapse: Secondary | ICD-10-CM | POA: Insufficient documentation

## 2020-07-14 DIAGNOSIS — Y990 Civilian activity done for income or pay: Secondary | ICD-10-CM | POA: Insufficient documentation

## 2020-07-14 DIAGNOSIS — R519 Headache, unspecified: Secondary | ICD-10-CM

## 2020-07-14 DIAGNOSIS — G44219 Episodic tension-type headache, not intractable: Secondary | ICD-10-CM | POA: Insufficient documentation

## 2020-07-14 HISTORY — DX: Personal history of other specified conditions: Z87.898

## 2020-07-14 LAB — CBC
HCT: 39.4 % (ref 36.0–46.0)
Hemoglobin: 13.3 g/dL (ref 12.0–15.0)
MCH: 26.6 pg (ref 26.0–34.0)
MCHC: 33.8 g/dL (ref 30.0–36.0)
MCV: 78.8 fL — ABNORMAL LOW (ref 80.0–100.0)
Platelets: 527 10*3/uL — ABNORMAL HIGH (ref 150–400)
RBC: 5 MIL/uL (ref 3.87–5.11)
RDW: 13.5 % (ref 11.5–15.5)
WBC: 7.7 10*3/uL (ref 4.0–10.5)
nRBC: 0 % (ref 0.0–0.2)

## 2020-07-14 LAB — URINALYSIS, ROUTINE W REFLEX MICROSCOPIC
Bilirubin Urine: NEGATIVE
Glucose, UA: NEGATIVE mg/dL
Hgb urine dipstick: NEGATIVE
Ketones, ur: NEGATIVE mg/dL
Leukocytes,Ua: NEGATIVE
Nitrite: NEGATIVE
Protein, ur: NEGATIVE mg/dL
Specific Gravity, Urine: 1.017 (ref 1.005–1.030)
pH: 7 (ref 5.0–8.0)

## 2020-07-14 LAB — BASIC METABOLIC PANEL
Anion gap: 11 (ref 5–15)
BUN: 9 mg/dL (ref 6–20)
CO2: 25 mmol/L (ref 22–32)
Calcium: 9 mg/dL (ref 8.9–10.3)
Chloride: 100 mmol/L (ref 98–111)
Creatinine, Ser: 0.7 mg/dL (ref 0.44–1.00)
GFR, Estimated: >60 mL/min (ref >60)
Glucose, Bld: 108 mg/dL — ABNORMAL HIGH (ref 70–99)
Potassium: 3.6 mmol/L (ref 3.5–5.1)
Sodium: 136 mmol/L (ref 135–145)

## 2020-07-14 LAB — TROPONIN I (HIGH SENSITIVITY): Troponin I (High Sensitivity): 3 ng/L (ref <18)

## 2020-07-14 LAB — I-STAT BETA HCG BLOOD, ED (MC, WL, AP ONLY): I-stat hCG, quantitative: <5 m[IU]/mL (ref <5)

## 2020-07-14 NOTE — ED Triage Notes (Signed)
Pt here from work where she had a near syncopal event , pt recently started on hctz for htn , did not hit her head , feels fine now

## 2020-07-15 ENCOUNTER — Emergency Department (HOSPITAL_COMMUNITY): Payer: BC Managed Care – PPO

## 2020-07-15 NOTE — Discharge Instructions (Signed)
Home to rest today. Follow up with your doctor. Return to the ER for worsening or concerning symptoms.

## 2020-07-15 NOTE — ED Provider Notes (Signed)
[facility] EMERGENCY DEPARTMENT Provider Note   CSN: [id number] Arrival date & time: 07/14/20  1604     History No chief complaint on file.   Melody Wells is a 40 y.o. female.  40 year old female presents with syncopal episode. Patient states she was at work yesterday when she had sudden onset global headache, went to the office at school and when she was leaving the office she turned and walked into/hit the door and passed out. Patient states she fell to the carpeted floor, denies injuries from the event, states coworkers were calling her name as she woke up. No loss of bowel or bladder control. Reports feeling dizzy/light headed with blurred vision before passing out. No history of similar. Reports starting HCTZ earlier this month for HTN, has had more frequent headaches since diagnosis of HTN but not as severe as today's headaches. Head pain has since improved. Denies nausea, vomiting, chest pain, palpitations. Reports pain on right side flank/chest, improves with rest, returns with stretching. No other complaints or concerns.         Past Medical History:  Diagnosis Date   Anemia    Medical history non-contributory    Sickle cell trait [facility])     Patient Active Problem List   Diagnosis Date Noted   Active labor 05/23/2014   Postoperative state 05/23/2014   Postpartum care following cesarean delivery (11/28) 05/23/2014    Past Surgical History:  Procedure Laterality Date   CESAREAN SECTION N/A 05/23/2014   Procedure: CESAREAN SECTION;  Surgeon: Genia Del, MD;  Location: [facility];  Service: Obstetrics;  Laterality: N/A;   cyst removal on left wrist     GANGLION CYST EXCISION     x2, left wrist   INDUCED ABORTION       OB History    Gravida  2   Para  1   Term  1   Preterm      AB  1   Living  1     SAB  0   IAB  1   Ectopic      Multiple  0   Live Births  1           Family History  Problem Relation Age  of Onset   Hearing loss Neg Hx     Social History   Tobacco Use   Smoking status: Never Smoker   Smokeless tobacco: Never Used  Substance Use Topics   Alcohol use: Yes   Drug use: No    Home Medications Prior to Admission medications   Medication Sig Start Date End Date Taking? Authorizing Provider  hydrochlorothiazide (HYDRODIURIL) 25 MG tablet Take 25 mg by mouth daily. 06/28/20  Yes [provider]  escitalopram (LEXAPRO) 10 MG tablet Take 15 mg by mouth daily. Patient not taking: No sig reported 01/20/20   [provider]  ferrous sulfate 325 (65 FE) MG tablet Take 1 tablet (325 mg total) by mouth 2 (two) times daily with a meal. Patient not taking: No sig reported 05/26/14   Raelyn Mora, CNM  hydrOXYzine (ATARAX/VISTARIL) 25 MG tablet Take 1 tablet (25 mg total) by mouth every 6 (six) hours as needed for itching. Patient not taking: No sig reported 12/02/14   Charm Rings, MD  ibuprofen (ADVIL,MOTRIN) 600 MG tablet Take 1 tablet (600 mg total) by mouth every 6 (six) hours as needed. Patient not taking: No sig reported 03/29/16   Barrett Henle, PA-C  JUNEL  FE 1/20 1-20 MG-MCG tablet Take 1 tablet by mouth daily. Patient not taking: No sig reported 07/01/20   [provider]  methocarbamol (ROBAXIN) 500 MG tablet Take 1 tablet (500 mg total) by mouth 2 (two) times daily. Patient not taking: No sig reported 03/29/16   Barrett Henle, PA-C  oxyCODONE-acetaminophen (PERCOCET/ROXICET) 5-325 MG per tablet Take 1 tablet by mouth every 4 (four) hours as needed (for pain scale less than 7). Patient not taking: No sig reported 05/26/14   Raelyn Mora, CNM  permethrin (ELIMITE) 5 % cream Apply from neck down.  Leave in place 12 hours, then rinse off.  Repeat in 1 week if needed. Patient not taking: No sig reported 12/02/14   Charm Rings, MD    Allergies    Patient has no known allergies.  Review of Systems   Review of Systems   Constitutional: Negative for chills and fever.  Eyes: Positive for visual disturbance.  Respiratory: Negative for shortness of breath.   Cardiovascular: Negative for chest pain and palpitations.  Gastrointestinal: Negative for nausea and vomiting.  Musculoskeletal: Negative for arthralgias, back pain, gait problem, joint swelling, myalgias and neck pain.  Skin: Negative for rash and wound.  Allergic/Immunologic: Negative for immunocompromised state.  Neurological: Positive for dizziness, light-headedness and headaches. Negative for facial asymmetry, speech difficulty, weakness and numbness.  Psychiatric/Behavioral: Negative for confusion.  All other systems reviewed and are negative.   Physical Exam Updated Vital Signs BP 133/80 (BP Location: Right Arm)    Pulse 73    Temp 98.6 F (37 C) (Oral)    Resp 17    Ht 5\' 5"  (1.651 m)    Wt 91.2 kg    SpO2 98%    BMI 33.45 kg/m   Physical Exam Vitals and nursing note reviewed.  Constitutional:      General: She is not in acute distress.    Appearance: She is well-developed and well-nourished. She is not diaphoretic.  HENT:     Head: Normocephalic and atraumatic.  Eyes:     Extraocular Movements: Extraocular movements intact.     Pupils: Pupils are equal, round, and reactive to light.  Cardiovascular:     Rate and Rhythm: Normal rate and regular rhythm.     Pulses: Normal pulses.     Heart sounds: Normal heart sounds.  Pulmonary:     Effort: Pulmonary effort is normal.     Breath sounds: Normal breath sounds.  Abdominal:     Palpations: Abdomen is soft.     Tenderness: There is no abdominal tenderness.  Musculoskeletal:        General: No swelling or tenderness. Normal range of motion.     Cervical back: Normal range of motion and neck supple. No tenderness.     Right lower leg: No edema.     Left lower leg: No edema.  Skin:    General: Skin is dry.     Findings: No erythema or rash.  Neurological:     Mental Status: She is  alert and oriented to person, place, and time.     Cranial Nerves: No cranial nerve deficit.     Sensory: No sensory deficit.     Motor: No weakness.     Gait: Gait normal.  Psychiatric:        Mood and Affect: Mood and affect normal.        Behavior: Behavior normal.     ED Results / Procedures / Treatments  Labs (all labs ordered are listed, but only abnormal results are displayed) Labs Reviewed  BASIC METABOLIC PANEL - Abnormal; Notable for the following components:      Result Value   Glucose, Bld 108 (*)    All other components within normal limits  CBC - Abnormal; Notable for the following components:   MCV 78.8 (*)    Platelets 527 (*)    All other components within normal limits  URINALYSIS, ROUTINE W REFLEX MICROSCOPIC - Abnormal; Notable for the following components:   APPearance CLOUDY (*)    All other components within normal limits  I-STAT BETA HCG BLOOD, ED ([facility], [facility], [facility] ONLY)  TROPONIN I (HIGH SENSITIVITY)    EKG EKG Interpretation  Date/Time:  Wednesday July 14 2020 17:29:34 EST Ventricular Rate:  91 PR Interval:  154 QRS Duration: 76 QT Interval:  364 QTC Calculation: 447 R Axis:   55 Text Interpretation: Normal sinus rhythm Possible Left atrial enlargement Borderline ECG No significant change since last tracing Confirmed by Linwood Dibbles [phone]) on 07/15/2020 8:28:41 AM   Radiology CT Head Wo Contrast  Result Date: 07/15/2020 CLINICAL DATA:  Headache, new or worsening, positional (Age 39-49y) EXAM: CT HEAD WITHOUT CONTRAST TECHNIQUE: Contiguous axial images were obtained from the base of the skull through the vertex without intravenous contrast. COMPARISON:  None. FINDINGS: Brain: No acute infarct or intracranial hemorrhage. No mass lesion. No midline shift, ventriculomegaly or extra-axial fluid collection. Vascular: No hyperdense vessel or unexpected calcification. Skull: Negative for fracture or focal lesion. Sinuses/Orbits: Normal orbits. Clear  paranasal sinuses. No mastoid effusion. Other: None. IMPRESSION: No acute intracranial process. Electronically Signed   By: Stana Bunting M.D.   On: 07/15/2020 08:09    Procedures Procedures (including critical care time)  Medications Ordered in ED Medications - No data to display  ED Course  I have reviewed the triage vital signs and the nursing notes.  Pertinent labs & imaging results that were available during my care of the patient were reviewed by me and considered in my medical decision making (see chart for details).  Clinical Course as of 07/15/20 0858  Thu Jul 15, 2020  [id number] 40 year old female with complaint of headache with syncopal episode. Exam without focal findings. Labs unremarkable including CBC, BMP, UA, trop x 1, UA. Vitals with improvement in BP. EKG NSR. Head CT unremarkable.  Discussed results with patient, patient is feeling better, plan is to follow up with PCP for referral to cardiology and further work up.  [LM]    Clinical Course User Index [LM] Alden Hipp   MDM Rules/Calculators/A&P                          Final Clinical Impression(s) / ED Diagnoses Final diagnoses:  Syncope, unspecified syncope type  Nonintractable episodic headache, unspecified headache type    Rx / DC Orders ED Discharge Orders    None       Jeannie Fend, PA-C 07/15/20 9390    Linwood Dibbles, MD 07/16/20 [id number]

## 2020-07-16 ENCOUNTER — Telehealth (INDEPENDENT_AMBULATORY_CARE_PROVIDER_SITE_OTHER): Payer: BC Managed Care – PPO | Admitting: Cardiovascular Disease

## 2020-07-16 ENCOUNTER — Encounter: Payer: Self-pay | Admitting: Cardiovascular Disease

## 2020-07-16 VITALS — BP 140/87 | HR 88 | Ht 65.0 in | Wt 203.6 lb

## 2020-07-16 DIAGNOSIS — I10 Essential (primary) hypertension: Secondary | ICD-10-CM | POA: Diagnosis not present

## 2020-07-16 DIAGNOSIS — Z6833 Body mass index (BMI) 33.0-33.9, adult: Secondary | ICD-10-CM

## 2020-07-16 DIAGNOSIS — E669 Obesity, unspecified: Secondary | ICD-10-CM | POA: Insufficient documentation

## 2020-07-16 DIAGNOSIS — E6609 Other obesity due to excess calories: Secondary | ICD-10-CM

## 2020-07-16 DIAGNOSIS — R55 Syncope and collapse: Secondary | ICD-10-CM

## 2020-07-16 HISTORY — DX: Syncope and collapse: R55

## 2020-07-16 HISTORY — DX: Obesity, unspecified: E66.9

## 2020-07-16 HISTORY — DX: Essential (primary) hypertension: I10

## 2020-07-16 MED ORDER — AMLODIPINE BESYLATE 5 MG PO TABS: 5.0000 mg | ORAL_TABLET | Freq: Every day | ORAL | 3 refills | Status: DC

## 2020-07-16 NOTE — Progress Notes (Signed)
Virtual Visit via Video Note   This visit type was conducted due to national recommendations for restrictions regarding the COVID-19 Pandemic (e.g. social distancing) in an effort to limit this patient's exposure and mitigate transmission in our community.  Due to her co-morbid illnesses, this patient is at least at moderate risk for complications without adequate follow up.  This format is felt to be most appropriate for this patient at this time.  All issues noted in this document were discussed and addressed.  A limited physical exam was performed with this format.  Please refer to the patient's chart for her consent to telehealth for [facility]   Date:  07/16/2020   ID:  Melody Wells, DOB 1981-05-18, MRN [id number] The patient was identified using 2 identifiers.  Patient Location: Home Provider Location: Office/Clinic  PCP:  Deatra James, MD  Cardiologist:  Chilton Si, MD  Electrophysiologist:  None   Evaluation Performed:  Follow-Up Visit  Chief Complaint:  syncope  History of Present Illness:    Melody Wells is a 40 y.o. female with hypertension and sickle cell trait here for evaluation of syncope.  In November she went to urgent care for cough and her BP was elevated to 170s/100s.  Her BP remained elevated so she followed up with her PCP and was started on HCTZ.  Since then her BP has remained elevated.  Her BP has been mostly in the 145-156/90-110s.  On Wednesday she was standing and talking to a coworker and got a sudden headache.  She started feeling lightheaded so she sat for a while.  She then got up to go to the office and on her way out her door was blurry and she walked into the door.  She felt pre-syncopal but stumbled to the next room where she collapsed face forward.  They called EMS and her glucose was normal.  Her BP was 150 systolic.  They told her that her heart rate was low.  EKG was reportedly normal.  She went to the ED and waited 18 hours.  She has  been eating and drinking well. She had no preceding chest pain or palpitations.    She has no prior hisotry of feeling lightheaded or dizzy.  She has no history of syncope.  She did feel lightheaded when she went to urgent care and her BP was elevated.  Since her hypertension diagnosis Ms. Melody Wells has been exercising daily for at least 30 minutes.  She does intense cardio and has no chest pain.  Her breathing is stable.  She has no palpitations or lightheadedness with exercise.  She has no LE edema, orthopnea or PND.  She struggles some with her diet.  She often eats out for dinner.  She has been trying to cut back on salt.  She rarely has caffeine and drinks alcohol occasionally on weekends.  She does not use any NSAIDs.  No Lexapro is on her medication list she has not taken it in months.  Her mood has been stable.  The patient does not have symptoms concerning for COVID-19 infection (fever, chills, cough, or new shortness of breath).    Past Medical History:  Diagnosis Date  . Anemia   . Essential hypertension 07/16/2020  . Medical history non-contributory   . Obesity 07/16/2020  . Sickle cell trait (HCC)   . Syncope and collapse 07/16/2020   Past Surgical History:  Procedure Laterality Date  . CESAREAN SECTION N/A 05/23/2014   Procedure: CESAREAN SECTION;  Surgeon: Awilda Metro  Seymour Bars, MD;  Location: [facility];  Service: Obstetrics;  Laterality: N/A;  . cyst removal on left wrist    . GANGLION CYST EXCISION     x2, left wrist  . INDUCED ABORTION       Current Meds  Medication Sig  . amLODipine (NORVASC) 5 MG tablet Take 1 tablet (5 mg total) by mouth daily.  Marland Kitchen escitalopram (LEXAPRO) 10 MG tablet Take 15 mg by mouth daily.  . ferrous sulfate 325 (65 FE) MG tablet Take 1 tablet (325 mg total) by mouth 2 (two) times daily with a meal.  . hydrochlorothiazide (HYDRODIURIL) 25 MG tablet Take 25 mg by mouth daily.  . hydrOXYzine (ATARAX/VISTARIL) 25 MG tablet Take 1 tablet (25 mg total) by mouth  every 6 (six) hours as needed for itching.  Colleen Can FE 1/20 1-20 MG-MCG tablet Take 1 tablet by mouth daily.  . permethrin (ELIMITE) 5 % cream Apply from neck down.  Leave in place 12 hours, then rinse off.  Repeat in 1 week if needed. (Patient taking differently: Apply from neck down.  Leave in place 12 hours, then rinse off.  Repeat in 1 week if needed.)  . [DISCONTINUED] ibuprofen (ADVIL,MOTRIN) 600 MG tablet Take 1 tablet (600 mg total) by mouth every 6 (six) hours as needed.  . [DISCONTINUED] methocarbamol (ROBAXIN) 500 MG tablet Take 1 tablet (500 mg total) by mouth 2 (two) times daily.  . [DISCONTINUED] oxyCODONE-acetaminophen (PERCOCET/ROXICET) 5-325 MG per tablet Take 1 tablet by mouth every 4 (four) hours as needed (for pain scale less than 7).     Allergies:   Patient has no known allergies.   Social History   Tobacco Use  . Smoking status: Never Smoker  . Smokeless tobacco: Never Used  Substance Use Topics  . Alcohol use: Yes  . Drug use: No     Family Hx: The patient's family history includes Healthy in her father and mother; Hypertension in her cousin; Sickle cell trait in her sister. There is no history of Hearing loss.  ROS:   Please see the history of present illness.    All other systems reviewed and are negative.   Prior CV studies:   The following studies were reviewed today:  Renal Dopplers pending  Labs/Other Tests and Data Reviewed:    EKG:  An ECG dated 07/14/2020 was personally reviewed today and demonstrated:  Sinus rhythm.  Rate 91 bpm.  Recent Labs: 07/14/2020: BUN 9; Creatinine, Ser 0.70; Hemoglobin 13.3; Platelets 527; Potassium 3.6; Sodium 136   Recent Lipid Panel No results found for: CHOL, TRIG, HDL, CHOLHDL, LDLCALC, LDLDIRECT  Wt Readings from Last 3 Encounters:  07/16/20 203 lb 9.6 oz (92.4 kg)  07/14/20 201 lb (91.2 kg)  05/23/20 200 lb (90.7 kg)     Risk Assessment/Calculations:      Objective:    BP 140/87   Pulse 88   Ht 5'  5" (1.651 m)   Wt 203 lb 9.6 oz (92.4 kg)   BMI 33.88 kg/m  GENERAL: Well-appearing.  No acute distress. HEENT: Pupils equal round.  Oral mucosa unremarkable NECK:  No jugular venous distention, no visible thyromegaly EXT:  No edema, no cyanosis no clubbing SKIN:  No rashes no nodules NEURO:  Speech fluent.  Cranial nerves grossly intact.  Moves all 4 extremities freely PSYCH:  Cognitively intact, oriented to person place and time  ASSESSMENT & PLAN:    # Syncope:  Ms. Reddoch episode seems as though it was vagal  in nature.  I suspect it was triggered by her severe headache.  Headache seems as though it may have been related to her poorly controlled hypertension.  In general she does not have episodes of syncope or presyncope.  The way she describes this episode is pretty classic for vagal situation.  It was likely compounded by the fact that she continue to struggle and stand and walk.  We did discuss the importance of listening to her body if she feels like she was going to pass out.  The new start of her diuretic likely did not help either.  She does not have any signs or symptoms of structural heart disease.  Continue to monitor for now.  I did encourage her to get back into her exercise routine.  Should she have any symptoms we will consider an echo and monitor.  Given her lack of symptoms otherwise, the utility of an ambulatory monitor seems well.  # Hypertension:  BP remains poorly controlled on HCTZ.  We will add amlodipine 5 mg daily.  She does not have a family history of hypertension and it seems to have begun relatively abruptly.  We will get renal artery Dopplers to evaluate for renal artery stenosis.  Check a TSH.  She was commended for her exercise.  She will work on trying to limit her sodium intake to 1500 mg.  She is not taking ibuprofen, which we discussed could increase her blood pressure.  She is also not taking Lexapro which can be associated with hypertension.  She is  working with her PCP and will be changing her OCP soon.   COVID-19 Education: The signs and symptoms of COVID-19 were discussed with the patient and how to seek care for testing (follow up with PCP or arrange E-visit).  The importance of social distancing was discussed today.  Time:   Today, I have spent 35 minutes with the patient with telehealth technology discussing the above problems.     Medication Adjustments/Labs and Tests Ordered: Current medicines are reviewed at length with the patient today.  Concerns regarding medicines are outlined above.   Tests Ordered: Orders Placed This Encounter  Procedures  . VAS US RENAL ARTERY DUPLEX    Medication Changes: Meds ordered this encounter  Medications  . amLODipine (NORVASC) 5 MG tablet    Sig: Take 1 tablet (5 mg total) by mouth daily.    Dispense:  90 tablet    Refill:  3    Follow Up:  In Person in 3 month(s)   Signed, Chilton Si, MD  07/16/2020 11:18 AM    [facility]

## 2020-07-16 NOTE — Patient Instructions (Addendum)
Medication Instructions:  START amlodipine 5mg  daily  *If you need a refill on your cardiac medications before your next appointment, please call your pharmacy*   Testing/Procedures: Renal Artery Doppler @ Dr. office   Follow-Up: At [facility], you and your health needs are our priority.  As part of our continuing mission to provide you with exceptional heart care, we have created designated Provider Care Teams.  These Care Teams include your primary Cardiologist (physician) and Advanced Practice Providers (APPs -  Physician Assistants and Nurse Practitioners) who all work together to provide you with the care you need, when you need it.  We recommend signing up for the patient portal called "MyChart".  Sign up information is provided on this After Visit Summary.  MyChart is used to connect with patients for Virtual Visits (Telemedicine).  Patients are able to view lab/test results, encounter notes, upcoming appointments, etc.  Non-urgent messages can be sent to your provider as well.   To learn more about what you can do with MyChart, go to [facility].    Your next appointment:   1 month with clinical pharmacy/hypertension clinic  3 months with [email or web address]   Other Instructions Limit Salt/Sodium to 1500mg  daily  Check BP at home twice daily - bring readings to your next visit

## 2020-08-16 ENCOUNTER — Ambulatory Visit (HOSPITAL_COMMUNITY)
Admission: RE | Admit: 2020-08-16 | Discharge: 2020-08-16 | Disposition: A | Discharge status: Home / Self Care | Payer: BC Managed Care – PPO | Source: Ambulatory Visit | Attending: Cardiovascular Disease | Admitting: Cardiovascular Disease

## 2020-08-16 ENCOUNTER — Other Ambulatory Visit: Payer: Self-pay

## 2020-08-16 DIAGNOSIS — I10 Essential (primary) hypertension: Secondary | ICD-10-CM | POA: Insufficient documentation

## 2020-08-24 ENCOUNTER — Telehealth: Payer: Self-pay | Admitting: *Deleted

## 2020-08-24 DIAGNOSIS — I701 Atherosclerosis of renal artery: Secondary | ICD-10-CM

## 2020-08-24 NOTE — Telephone Encounter (Signed)
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  There is also a cyst on the right kidney.  In her age she should not really have any blockage in her kidney arteries.  Recommend getting a CT-A of the abdomen and pelvis to better assess.

## 2020-08-24 NOTE — Telephone Encounter (Signed)
Advised patient, order placed, and message sent to scheduling department

## 2020-08-26 ENCOUNTER — Other Ambulatory Visit: Payer: Self-pay | Admitting: *Deleted

## 2020-08-26 ENCOUNTER — Telehealth: Payer: Self-pay | Admitting: Cardiovascular Disease

## 2020-08-26 DIAGNOSIS — Z01812 Encounter for preprocedural laboratory examination: Secondary | ICD-10-CM

## 2020-08-26 DIAGNOSIS — I10 Essential (primary) hypertension: Secondary | ICD-10-CM

## 2020-08-26 NOTE — Telephone Encounter (Signed)
I spoke with patient regarding the scheduled CTA abdomen and pelvis appointment on 09/02/20 at 3:00 pm at [facility] CT.  Patient is asking if this will scan her kidneys as well--  Please advise.

## 2020-08-26 NOTE — Telephone Encounter (Signed)
Spoke with pt, aware CTA will show her kidneys.

## 2020-08-31 LAB — BASIC METABOLIC PANEL
BUN/Creatinine Ratio: 17 (ref 9–23)
BUN: 11 mg/dL (ref 6–20)
CO2: 18 mmol/L — ABNORMAL LOW (ref 20–29)
Calcium: 9 mg/dL (ref 8.7–10.2)
Chloride: 104 mmol/L (ref 96–106)
Creatinine, Ser: 0.64 mg/dL (ref 0.57–1.00)
Glucose: 82 mg/dL (ref 65–99)
Potassium: 4.3 mmol/L (ref 3.5–5.2)
Sodium: 139 mmol/L (ref 134–144)
eGFR: 116 mL/min/{1.73_m2} (ref >59)

## 2020-09-02 ENCOUNTER — Ambulatory Visit (INDEPENDENT_AMBULATORY_CARE_PROVIDER_SITE_OTHER)
Admission: RE | Admit: 2020-09-02 | Discharge: 2020-09-02 | Disposition: A | Discharge status: Home / Self Care | Payer: BC Managed Care – PPO | Source: Ambulatory Visit | Attending: Cardiovascular Disease | Admitting: Cardiovascular Disease

## 2020-09-02 ENCOUNTER — Other Ambulatory Visit: Payer: Self-pay

## 2020-09-02 DIAGNOSIS — I701 Atherosclerosis of renal artery: Secondary | ICD-10-CM | POA: Diagnosis not present

## 2020-09-02 MED ORDER — IOHEXOL 350 MG/ML SOLN
100.0000 mL | Freq: Once | INTRAVENOUS | Status: AC | PRN
  Administered 2020-09-02: 100 mL via INTRAVENOUS

## 2021-08-23 ENCOUNTER — Other Ambulatory Visit: Payer: Self-pay | Admitting: Cardiovascular Disease

## 2021-08-23 NOTE — Telephone Encounter (Signed)
Rx(s) sent to pharmacy electronically.  

## 2021-12-09 ENCOUNTER — Other Ambulatory Visit: Payer: Self-pay | Admitting: Cardiovascular Disease

## 2021-12-09 NOTE — Telephone Encounter (Signed)
Rx request sent to pharmacy.  

## 2021-12-12 ENCOUNTER — Emergency Department (HOSPITAL_COMMUNITY)
Admission: EM | Admit: 2021-12-12 | Discharge: 2021-12-13 | Disposition: A | Discharge status: Home / Self Care | Payer: BC Managed Care – PPO | Attending: Emergency Medicine | Admitting: Emergency Medicine

## 2021-12-12 ENCOUNTER — Other Ambulatory Visit: Payer: Self-pay

## 2021-12-12 ENCOUNTER — Emergency Department (HOSPITAL_COMMUNITY): Payer: BC Managed Care – PPO

## 2021-12-12 DIAGNOSIS — R4182 Altered mental status, unspecified: Secondary | ICD-10-CM | POA: Insufficient documentation

## 2021-12-12 DIAGNOSIS — I10 Essential (primary) hypertension: Secondary | ICD-10-CM | POA: Diagnosis not present

## 2021-12-12 DIAGNOSIS — Z79899 Other long term (current) drug therapy: Secondary | ICD-10-CM | POA: Insufficient documentation

## 2021-12-12 DIAGNOSIS — F419 Anxiety disorder, unspecified: Secondary | ICD-10-CM | POA: Diagnosis not present

## 2021-12-12 DIAGNOSIS — R202 Paresthesia of skin: Secondary | ICD-10-CM | POA: Diagnosis present

## 2021-12-12 LAB — COMPREHENSIVE METABOLIC PANEL
ALT: 24 U/L (ref 0–44)
AST: 25 U/L (ref 15–41)
Albumin: 3.9 g/dL (ref 3.5–5.0)
Alkaline Phosphatase: 77 U/L (ref 38–126)
Anion gap: 9 (ref 5–15)
BUN: 8 mg/dL (ref 6–20)
CO2: 24 mmol/L (ref 22–32)
Calcium: 8.6 mg/dL — ABNORMAL LOW (ref 8.9–10.3)
Chloride: 107 mmol/L (ref 98–111)
Creatinine, Ser: 0.69 mg/dL (ref 0.44–1.00)
GFR, Estimated: >60 mL/min (ref >60)
Glucose, Bld: 108 mg/dL — ABNORMAL HIGH (ref 70–99)
Potassium: 3.2 mmol/L — ABNORMAL LOW (ref 3.5–5.1)
Sodium: 140 mmol/L (ref 135–145)
Total Bilirubin: 0.6 mg/dL (ref 0.3–1.2)
Total Protein: 7.4 g/dL (ref 6.5–8.1)

## 2021-12-12 LAB — RAPID URINE DRUG SCREEN, HOSP PERFORMED
Amphetamines: NOT DETECTED
Barbiturates: NOT DETECTED
Benzodiazepines: NOT DETECTED
Cocaine: NOT DETECTED
Opiates: NOT DETECTED
Tetrahydrocannabinol: NOT DETECTED

## 2021-12-12 LAB — CBC WITH DIFFERENTIAL/PLATELET
Abs Immature Granulocytes: 0.03 10*3/uL (ref 0.00–0.07)
Basophils Absolute: 0.1 10*3/uL (ref 0.0–0.1)
Basophils Relative: 1 %
Eosinophils Absolute: 0.2 10*3/uL (ref 0.0–0.5)
Eosinophils Relative: 2 %
HCT: 38 % (ref 36.0–46.0)
Hemoglobin: 12.8 g/dL (ref 12.0–15.0)
Immature Granulocytes: 0 %
Lymphocytes Relative: 23 %
Lymphs Abs: 1.7 10*3/uL (ref 0.7–4.0)
MCH: 27.5 pg (ref 26.0–34.0)
MCHC: 33.7 g/dL (ref 30.0–36.0)
MCV: 81.7 fL (ref 80.0–100.0)
Monocytes Absolute: 0.5 10*3/uL (ref 0.1–1.0)
Monocytes Relative: 7 %
Neutro Abs: 5.1 10*3/uL (ref 1.7–7.7)
Neutrophils Relative %: 67 %
Platelets: 361 10*3/uL (ref 150–400)
RBC: 4.65 MIL/uL (ref 3.87–5.11)
RDW: 13.9 % (ref 11.5–15.5)
WBC: 7.6 10*3/uL (ref 4.0–10.5)
nRBC: 0 % (ref 0.0–0.2)

## 2021-12-12 LAB — TROPONIN I (HIGH SENSITIVITY)
Troponin I (High Sensitivity): 6 ng/L (ref <18)
Troponin I (High Sensitivity): 6 ng/L (ref <18)

## 2021-12-12 LAB — URINALYSIS, ROUTINE W REFLEX MICROSCOPIC
Bilirubin Urine: NEGATIVE
Glucose, UA: NEGATIVE mg/dL
Ketones, ur: NEGATIVE mg/dL
Nitrite: NEGATIVE
Protein, ur: NEGATIVE mg/dL
RBC / HPF: >50 RBC/hpf — ABNORMAL HIGH (ref 0–5)
Specific Gravity, Urine: 1.006 (ref 1.005–1.030)
pH: 8 (ref 5.0–8.0)

## 2021-12-12 LAB — I-STAT BETA HCG BLOOD, ED (MC, WL, AP ONLY): I-stat hCG, quantitative: <5 m[IU]/mL (ref <5)

## 2021-12-12 MED ORDER — AMLODIPINE BESYLATE 5 MG PO TABS
5.0000 mg | ORAL_TABLET | Freq: Once | ORAL | Status: DC
  Filled 2021-12-12: qty 1

## 2021-12-12 MED ORDER — LORAZEPAM 1 MG PO TABS
1.0000 mg | ORAL_TABLET | Freq: Once | ORAL | Status: AC
  Administered 2021-12-12: 1 mg via ORAL
  Filled 2021-12-12: qty 1

## 2021-12-12 MED ORDER — LOSARTAN POTASSIUM 50 MG PO TABS
50.0000 mg | ORAL_TABLET | Freq: Once | ORAL | Status: AC
  Administered 2021-12-12: 50 mg via ORAL
  Filled 2021-12-12: qty 1

## 2021-12-12 MED ORDER — AMLODIPINE BESYLATE 5 MG PO TABS
10.0000 mg | ORAL_TABLET | Freq: Once | ORAL | Status: AC
  Administered 2021-12-12: 10 mg via ORAL
  Filled 2021-12-12: qty 2

## 2021-12-12 MED ORDER — HYDROCHLOROTHIAZIDE 25 MG PO TABS
25.0000 mg | ORAL_TABLET | Freq: Every day | ORAL | Status: DC
  Administered 2021-12-12: 25 mg via ORAL
  Filled 2021-12-12: qty 1

## 2021-12-12 NOTE — ED Triage Notes (Signed)
Pt bib [facility] from home hx of HTN and anxiety. Pt did not take medication this am, pt BP rose to 218/106. Pt then had panic attack. Denies headache, n/v, or blurred vision.   EMS vitals 218/106 BP 94 HR 100% O2

## 2021-12-12 NOTE — ED Provider Notes (Signed)
Entered room to introduce self to patient.  Patient texting on her phone.  Asked patient what brought her to the ED today.  She did not stop texting and did not look up from her phone.  She stated "give me a few minutes".  Discussed that I have other patients to see and was ready to talk to her now about what brought her to the ED.  She continues to text.  She asked me to leave and see other patients and come back.   Glynn Octave, MD 12/12/21 1713    Glynn Octave, MD 12/13/21 1014

## 2021-12-12 NOTE — Discharge Instructions (Addendum)
Return if any problems.

## 2021-12-12 NOTE — ED Notes (Signed)
Patient got dressed and is refusing to leave room. RN asked patient if she needed wheelchair/assistance in leaving. Pt denies. Pt informed room is needed after d/c for next patient. patient states "if you would just leave me alone. Ill leave when I am ready."

## 2021-12-12 NOTE — ED Provider Notes (Addendum)
[facility] EMERGENCY DEPARTMENT Provider Note   CSN: [id number] Arrival date & time: 12/12/21  1628     History  Chief Complaint  Patient presents with   Hypertension    Melody Wells is a 41 y.o. female.  Patient complains of elevated blood pressure patient reports that she began having tingling in both of her hands while she was driving to [place] from [place].  Patient is concerned that she is having a stroke.  Patient states she did not take her blood pressure medicine this morning because she was concerned about her son.  The history is provided by the patient. No language interpreter was used.  Hypertension This is a recurrent problem. The current episode started less than 1 hour ago. The problem occurs constantly. The problem has not changed since onset.Pertinent negatives include no headaches. Nothing aggravates the symptoms. Nothing relieves the symptoms. She has tried nothing for the symptoms.       Home Medications Prior to Admission medications   Medication Sig Start Date End Date Taking? Authorizing Provider  amLODipine (NORVASC) 5 MG tablet Take 1 tablet (5 mg total) by mouth daily. Must call to schedule appointment for refills. 12/09/21   Chilton Si, MD  escitalopram (LEXAPRO) 10 MG tablet Take 15 mg by mouth daily. 01/20/20   [provider]  ferrous sulfate 325 (65 FE) MG tablet Take 1 tablet (325 mg total) by mouth 2 (two) times daily with a meal. 05/26/14   Raelyn Mora, CNM  hydrochlorothiazide (HYDRODIURIL) 25 MG tablet Take 25 mg by mouth daily. 06/28/20   [provider]  hydrOXYzine (ATARAX/VISTARIL) 25 MG tablet Take 1 tablet (25 mg total) by mouth every 6 (six) hours as needed for itching. 12/02/14   Charm Rings, MD  JUNEL FE 1/20 1-20 MG-MCG tablet Take 1 tablet by mouth daily. 07/01/20   [provider]  permethrin (ELIMITE) 5 % cream Apply from neck down.  Leave in place 12 hours, then rinse off.   Repeat in 1 week if needed. Patient taking differently: Apply from neck down.  Leave in place 12 hours, then rinse off.  Repeat in 1 week if needed. 12/02/14   Charm Rings, MD      Allergies    Patient has no known allergies.    Review of Systems   Review of Systems  Neurological:  Negative for headaches.  All other systems reviewed and are negative.   Physical Exam Updated Vital Signs BP (!) 150/103   Pulse 92   Temp 98.3 F (36.8 C) (Oral)   Resp 13   SpO2 100%  Physical Exam Vitals and nursing note reviewed.  Constitutional:      Appearance: She is well-developed.  HENT:     Head: Normocephalic.     Nose: Nose normal.  Eyes:     Pupils: Pupils are equal, round, and reactive to light.  Cardiovascular:     Rate and Rhythm: Normal rate and regular rhythm.  Pulmonary:     Effort: Pulmonary effort is normal.  Abdominal:     General: Abdomen is flat. There is no distension.  Musculoskeletal:        General: Normal range of motion.     Cervical back: Normal range of motion.  Skin:    General: Skin is warm.  Neurological:     General: No focal deficit present.     Mental Status: She is alert and oriented to person, place, and time.  ED Results / Procedures / Treatments   Labs (all labs ordered are listed, but only abnormal results are displayed) Labs Reviewed  COMPREHENSIVE METABOLIC PANEL - Abnormal; Notable for the following components:      Result Value   Potassium 3.2 (*)    Glucose, Bld 108 (*)    Calcium 8.6 (*)    All other components within normal limits  URINALYSIS, ROUTINE W REFLEX MICROSCOPIC - Abnormal; Notable for the following components:   Color, Urine COLORLESS (*)    Hgb urine dipstick LARGE (*)    Leukocytes,Ua TRACE (*)    RBC / HPF >50 (*)    Bacteria, UA RARE (*)    All other components within normal limits  CBC WITH DIFFERENTIAL/PLATELET  RAPID URINE DRUG SCREEN, HOSP PERFORMED  I-STAT BETA HCG BLOOD, ED ([facility], [facility], [facility] ONLY)  TROPONIN  I (HIGH SENSITIVITY)  TROPONIN I (HIGH SENSITIVITY)    EKG EKG Interpretation  Date/Time:  Monday December 12 2021 17:51:30 EDT Ventricular Rate:  83 PR Interval:  154 QRS Duration: 81 QT Interval:  361 QTC Calculation: 425 R Axis:   56 Text Interpretation: Sinus rhythm No significant change was found Confirmed by Glynn Octave [phone]) on 12/12/2021 5:57:59 PM  Radiology CT Head Wo Contrast  Result Date: 12/12/2021 CLINICAL DATA:  Altered mental status, nontraumatic EXAM: CT HEAD WITHOUT CONTRAST TECHNIQUE: Contiguous axial images were obtained from the base of the skull through the vertex without intravenous contrast. RADIATION DOSE REDUCTION: This exam was performed according to the departmental dose-optimization program which includes automated exposure control, adjustment of the mA and/or kV according to patient size and/or use of iterative reconstruction technique. COMPARISON:  None Available. FINDINGS: Brain: No evidence of acute infarction, hemorrhage, hydrocephalus, extra-axial collection or mass lesion/mass effect. Vascular: No hyperdense vessel or unexpected calcification. Skull: Normal. Negative for fracture or focal lesion. Sinuses/Orbits: Retention cyst in the right maxillary antrum. No acute air-fluid levels. Mastoid air cells are clear. Other: None. IMPRESSION: No acute intracranial abnormalities. Electronically Signed   By: Burman Nieves M.D.   On: 12/12/2021 22:34    Procedures Procedures    Medications Ordered in ED Medications  hydrochlorothiazide (HYDRODIURIL) tablet 25 mg (25 mg Oral Given 12/12/21 1841)  amLODipine (NORVASC) tablet 10 mg (10 mg Oral Given 12/12/21 1840)  losartan (COZAAR) tablet 50 mg (50 mg Oral Given 12/12/21 1841)  LORazepam (ATIVAN) tablet 1 mg (1 mg Oral Given 12/12/21 1841)    ED Course/ Medical Decision Making/ A&P                           Medical Decision Making Complains of tingling in her hand and concerned that she is having a  stroke  Amount and/or Complexity of Data Reviewed Independent Historian: parent    Details: Patient here with a family member who said she is patient's adopted mom.  She reports patient has been anxious recently and verbally aggressive to family. External Data Reviewed: notes.    Details: Audiology notes reviewed Labs: ordered. Decision-making details documented in ED Course.    Details: Labs ordered, reviewed and interpreted troponin is negative x2 Radiology: ordered and independent interpretation performed. Decision-making details documented in ED Course.    Details: CT head is negative ECG/medicine tests: ordered and independent interpretation performed. Decision-making details documented in ED Course.    Details: EKG is nonacute  Risk Prescription drug management.    On reevaluation patient reports she is concerned about  going home patient reports she inhaled some smoke while visiting in [place].  Patient request a chest x-ray. Patient reports she has been having problems with her family.  Patient ask her family members to leave.  Patient is concerned that family members will come to her home and harm her.  Patient does not want to speak to police or social work.  Patient states she has a Veterinary surgeon but she missed her appointment today Patient seems to have some issues with anxiety.  Pt offered TTS assessment and she declines.  Pt reports she has no thoughts of self harm or harming anyone else.         Final Clinical Impression(s) / ED Diagnoses Final diagnoses:  Hypertension, unspecified type  Anxiety    Rx / DC Orders ED Discharge Orders     None         Elson Areas, Cordelia Poche 12/12/21 2325    Osie Cheeks 12/12/21 2336    Glynn Octave, MD 12/12/21 2350

## 2021-12-14 ENCOUNTER — Ambulatory Visit (HOSPITAL_COMMUNITY)
Admission: EM | Admit: 2021-12-14 | Discharge: 2021-12-14 | Disposition: A | Discharge status: Home / Self Care | Payer: BC Managed Care – PPO

## 2021-12-14 DIAGNOSIS — F39 Unspecified mood [affective] disorder: Secondary | ICD-10-CM

## 2021-12-14 NOTE — Discharge Instructions (Addendum)
Outpatient Therapy and Medication Management  [facility] [address] [place], [place] [address] [phone]  [facility] Outpatient Services/ Intensive Outpatient Therapy Program [address] [place], [place] [address] [phone]  [facility] [address] [place], [place] [address] [phone]  [facility]    [address]    [place], [place] [address]     [phone]       Andee Poles, MD     [address]     [place] [address]     [phone]       [facility] [address] [place] [address]  [facility]     [address] [place], [place]      [phone]     Discharge recommendations:  Patient is to take medications as prescribed. Please see information for follow-up appointment with psychiatry and therapy. Please follow up with your primary care provider for all medical related needs.   Therapy: We recommend that patient participate in individual therapy to address mental health concerns.   Safety:  The patient should abstain from use of illicit substances/drugs and abuse of any medications. If symptoms worsen or do not continue to improve or if the patient becomes actively suicidal or homicidal then it is recommended that the patient return to the closest hospital emergency department, the [facility], or call 911 for further evaluation and treatment. National Suicide Prevention Lifeline 1-800-SUICIDE or (253)626-4977.  About 988 988 offers 24/7 access to trained crisis counselors who can help people experiencing mental health-related distress. People can call or text 988 or chat 988lifeline.org for themselves or if they are worried about a loved one who may need crisis support.

## 2021-12-14 NOTE — ED Provider Notes (Incomplete)
Behavioral Health Urgent Care Medical Screening Exam  Patient Name: Melody Wells MRN: [id number] Date of Evaluation: 12/14/21 Chief Complaint:  "I'm just not myself" Diagnosis:  Final diagnoses:  Unspecified mood (affective) disorder (HCC)    History of Present illness: Melody Wells is a 41 y.o. female. Who presented for concerns related to having frequent mood swings at home. Pt reports " Melody Wells been having more episodes lashing out at people who are difficult to me". Melody Wells been saying things out of anger, and they are saying it back to me, and we've got into these verbal arguments". "Ive just lost my sorority friends, and Im hoping they will apologize". Pt reports " My friends and family have noticed that I'm not right, and for the past month, I've noticed that I'm becoming worse, and more argumentative within the past two weeks". "Ive been having insomnia, irritability, confusion at times, sometimes I break out in tears, appetite changes, weight gain, and I turned to alcohol more". Pt reports " I take 2 drinks 2-3 times a week. " I used to not drink, except when I'm out with friends". 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 Pt reports that all her students passed their math EOG test and grade assessments and she is very proud of her achievements. Pt denies SI/HI/AVH. Pt reports she has a 44 year old son and owns a home. Pt denies any medication allergies. Pt reports she has has been dealing with stress related to her blood pressure, and has been to the ED twice recently due to hypertension.  Pt reports she sees a mental health therapist every other day, and takes Lexapro 20 mg daily, but is unaware of the diagnosis for the medication. Pt reports she sees a therapist every other day but was unable to meet with her on Monday because she was at the ED. Pt reports she has conflicts with her mother, baby daddy, sorority friends and co-workers because they are not supportive  or checking on her like she checks on them. Pt reports she shouted at her mom yesterday because she did not see her point of view.   On evaluation, patient is alert, oriented x 4. Speech is clear, coherent and logical. Pt appears well groomed. Eye contact is fair. Mood is anxious, affect is congruent with mood. Thought process is logical and thought content is coherent. Pt denies SI/HI/AVH. There is no indication that the patient is responding to internal stimuli. No delusions elicited during this assessment.       Psychiatric Specialty Exam  Presentation  General Appearance:Appropriate for Environment  Eye Contact:Fair  Speech:Clear and Coherent  Speech Volume:Normal  Handedness:Right   Mood and Affect  Mood:Euthymic  Affect:Congruent   Thought Process  Thought Processes:Coherent  Descriptions of Associations:Intact  Orientation:Full (Time, Place and Person)  Thought Content:Logical    Hallucinations:None  Ideas of Reference:None  Suicidal Thoughts:No  Homicidal Thoughts:No   Sensorium  Memory:Immediate Good; Recent Good  Judgment:Good  Insight:Good   Executive Functions  Concentration:Good  Attention Span:Good  Recall:Good  Fund of Knowledge:Good  Language:Good   Psychomotor Activity  Psychomotor Activity:Normal   Assets  Assets:Communication Skills; Desire for Improvement; Talents/Skills; Transportation; Housing   Sleep  Sleep:Fair  Number of hours: No data recorded  Nutritional Assessment (For OBS and FBC admissions only) Has the patient had a weight loss or gain of 10 pounds or more in the last 3 months?: No Has the patient had a decrease in food  intake/or appetite?: No Does the patient have dental problems?: No Does the patient have eating habits or behaviors that may be indicators of an eating disorder including binging or inducing vomiting?: No Has the patient recently lost weight without trying?: 0 Has the patient been eating  poorly because of a decreased appetite?: 0 Malnutrition Screening Tool Score: 0    Physical Exam: Physical Exam Constitutional:      Appearance: She is not toxic-appearing or diaphoretic.  HENT:     Head: Normocephalic.     Right Ear: External ear normal.     Left Ear: External ear normal.  Eyes:     General:        Right eye: No discharge.        Left eye: No discharge.  Cardiovascular:     Rate and Rhythm: Normal rate.  Pulmonary:     Effort: No respiratory distress.  Chest:     Chest wall: No tenderness.  Neurological:     Mental Status: She is alert and oriented to person, place, and time.  Psychiatric:        Attention and Perception: She is attentive. She does not perceive auditory or visual hallucinations.        Mood and Affect: Mood is anxious. Mood is not depressed.        Speech: Speech normal.        Behavior: Behavior normal.        Thought Content: Thought content is not paranoid or delusional. Thought content does not include homicidal or suicidal ideation. Thought content does not include homicidal or suicidal plan.        Cognition and Memory: Cognition and memory normal.        Judgment: Judgment normal.    Review of Systems  Constitutional:  Negative for chills, diaphoresis and fever.  HENT:  Negative for congestion.   Eyes:  Negative for pain and discharge.  Respiratory:  Negative for cough and shortness of breath.   Cardiovascular:  Negative for chest pain and palpitations.  Gastrointestinal:  Negative for diarrhea, nausea and vomiting.  Neurological:  Negative for dizziness, seizures, loss of consciousness, weakness and headaches.  Psychiatric/Behavioral:  Negative for depression, hallucinations, substance abuse and suicidal ideas. The patient is nervous/anxious.    Blood pressure 134/79, pulse 87, temperature 98 F (36.7 C), temperature source Oral, resp. rate 18, SpO2 100 %, unknown if currently breastfeeding. There is no height or weight on file  to calculate BMI.  Musculoskeletal: Strength & Muscle Tone: within normal limits Gait & Station: normal Patient leans: N/A   [facility] MSE Discharge Disposition for Follow up and Recommendations: Based on my evaluation the patient does not appear to have an emergency medical condition and can be discharged with resources and follow up care in outpatient services for Medication Management and Individual Therapy. Pt provided with outpatient mental health resources. Pt discharged safely home.    Mancel Bale, NP 12/14/2021, 11:15 PM

## 2021-12-14 NOTE — ED Notes (Signed)
Melody Wells was found locked in assessment area restroom refusing to come out and talk to provider security was called and along with female staff opened the BR and asked her to come out. She was complaint  but angry using profanity to express her feelings stated " I came here for help and y'all are discharging me" The provider was present and explained to her that she would benefit from the resource information provided. She remained unhappy with her discharge but was complaint with staff request  taking her discharge paper work and resource information and leaving the building.

## 2021-12-14 NOTE — Progress Notes (Signed)
Resources for medication management, substance use, and outpatient therapy are listed in AVS.  Nancee Liter [facility]

## 2021-12-14 NOTE — Progress Notes (Signed)
   12/14/21 2015  [facility] Triage Screening (Walk-ins at [facility] only)  How Did You Hear About [place]? Self  What Is the Reason for Your Visit/Call Today? Melody Wells is a 41 year old female presenting as a voluntary walk-in to [facility] due to worsening symptoms of bipolar. Patient denied HI, psychosis and alcohol/drug usage. Patient reported that she has not been diagnosed with bipolar, however after researching and others inquiring about her rapid changes in moods, she feels she needed to inquire about her mental health. Patient has no prior mental health history. Patient reported no triggers or stressors. Patient requesting outpatient resources for treatment.  How Long Has This Been Causing You Problems? 1 wk - 1 month  Have You Recently Had Any Thoughts About Hurting Yourself? No  Are You Planning to Commit Suicide/Harm Yourself At This time? No  Have you Recently Had Thoughts About Hurting Someone [name]? No  Are You Planning To Harm Someone At This Time? No  Are you currently experiencing any auditory, visual or other hallucinations? No  Have You Used Any Alcohol or Drugs in the Past 24 Hours? No  Do you have any current medical co-morbidities that require immediate attention? No  Clinician description of patient physical appearance/behavior: casual / pleasant  What Do You Feel Would Help You the Most Today? Treatment for Depression or other mood problem  If access to [facility] was not available, would you have sought care in the Emergency Department? No  Determination of Need Routine (7 days)  Options For Referral Medication Management;Outpatient Therapy

## 2021-12-15 ENCOUNTER — Encounter (HOSPITAL_COMMUNITY): Payer: Self-pay | Admitting: Physician Assistant

## 2021-12-15 ENCOUNTER — Ambulatory Visit (INDEPENDENT_AMBULATORY_CARE_PROVIDER_SITE_OTHER): Payer: BC Managed Care – PPO | Admitting: Physician Assistant

## 2021-12-15 VITALS — BP 149/80 | HR 76 | Ht 64.0 in | Wt 211.0 lb

## 2021-12-15 DIAGNOSIS — F3112 Bipolar disorder, current episode manic without psychotic features, moderate: Secondary | ICD-10-CM

## 2021-12-15 MED ORDER — DIVALPROEX SODIUM 500 MG PO DR TAB: 500.0000 mg | DELAYED_RELEASE_TABLET | Freq: Two times a day (BID) | ORAL | 2 refills | Status: DC

## 2021-12-15 NOTE — Progress Notes (Addendum)
Psychiatric Initial Adult Assessment   Patient Identification: Melody Wells MRN:  [id number] Date of Evaluation:  12/15/2021 Referral Source: Behavioral Health Urgent Care/Walk-in Chief Complaint:   Chief Complaint  Patient presents with   WALK IN   Visit Diagnosis:    ICD-10-CM   1. Bipolar affective disorder, currently manic, moderate (HCC)  F31.12 divalproex (DEPAKOTE) 500 MG DR tablet      History of Present Illness:    Latha Staunton is a 41 year old female with a past psychiatric history significant for anxiety who presents to [facility] for psychiatric evaluation and medication management.  Prior to presenting to this encounter, patient was seen at [facility] due to presenting with mental health issues for a month.  She reports that her friends and family members first noticed a change in her mood and behavior.  Patient notes that she was lashing out at friends and family as well as having racing thoughts and mood swings.  Patient also notes that she has been having issues with sleep and may receive up to 4 hours of sleep accompanied by waking up throughout the night.  Patient reports that she recently had a sleep study performed and she should be getting results soon.  While she was seen at [facility], ED, patient's blood pressure was extremely elevated at 220/183 mmHg.  Patient reports that her current symptoms have never occurred in the past.  Patient is currently taking Lexapro and has been taking the medication since 2018.  Patient reports that she was originally taking the medication for anxiety.  She also states that she was placed on the medication after her grandfather went to sleep and did not wake up.  During the encounter, patient was extremely agitated and defensive at times.  She talked about how black people often have to work 10 times harder than their white counter partner. She commented to the provider Hydrographic surveyor) that  no matter how successful they, people will think of them as a "dumb black nigger" in a new position.  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  Patient rates her anxiety an 8 out of 10 and states that she has panic attacks once a week with her last panic attack occurring on Monday.  Patient's panic attacks are characterized by sweating, screaming, and gasping for air.  Patient denies having any other incidences of hospitalization due to mental health.  Patient denies past history of suicide attempt but does state that she once drove really fast on the highway without concern for safety.  A PHQ-9 screen was performed with the patient scoring a 15.  A GAD-7 screen was also performed with the patient scoring a 19.  Patient is alert and oriented x 4, irritable, and somewhat combative during the initial part of the encounter.  As the encounter went on, patient calmed down and was able to answer most questions addressed to her.  Patient denies suicidal ideations.  She denies homicidal ideations but states that she has thoughts of wanting to punch people.  Patient denies auditory or visual hallucinations and does not appear to be responding to internal/external stimuli.  She does endorse some paranoia stating that she had some fear/paranoia over another patient that she saw downstairs when she was assessed at [facility] yesterday.  Patient endorses fair sleep and receives on average 4 to 5 hours of intermittent sleep.  Patient endorses fluctuating appetite and states that she has been having decreased appetite as of late.  Patient endorses alcohol consumption occasionally.  Patient endorses tobacco use and smokes Black and Milds.  Patient denies active illicit drug use but states that she used to use marijuana  a long time ago.  Associated Signs/Symptoms: Depression Symptoms:  depressed mood, anhedonia, insomnia, psychomotor agitation, psychomotor retardation, fatigue, difficulty concentrating, impaired memory, recurrent thoughts of death, anxiety, panic attacks, loss of energy/fatigue, disturbed sleep, weight gain, increased appetite, decreased appetite, (Hypo) Manic Symptoms:  Distractibility, Elevated Mood, Flight of Ideas, Licensed conveyancer, Grandiosity, Impulsivity, Irritable Mood, Labiality of Mood, Sexually Inapproprite Behavior, Anxiety Symptoms:  Excessive Worry, Panic Symptoms, Obsessive Compulsive Symptoms:   Constantly cleaning (such as cleaning counter tops very well). Patient states that she has to make up her bed constantly and checking, Psychotic Symptoms:  Paranoia, PTSD Symptoms: Had a traumatic exposure:  Patient reports that her favorite passed away from breast cancer. Patient also states that the death of her Grandfather, who died unexpectedly, impacted her. Patient also reports that she feels that she is going to be single for the rest of her life Had a traumatic exposure in the last month:  N/A Re-experiencing:  Flashbacks Intrusive Thoughts Hypervigilance:  Yes Hyperarousal:  Difficulty Concentrating Emotional Numbness/Detachment Avoidance:  Decreased Interest/Participation Foreshortened Future  Past Psychiatric History:  Anxiety  Previous Psychotropic Medications: Yes , patient reports that she was placed on Lexapro back in 2018 around the time her grandfather passed away.  Patient was given Lexapro due to experiencing ongoing anxiety and depression.  Substance Abuse History in the last 12 months:  No.  Consequences of Substance Abuse: Medical Consequences:  None Legal Consequences:  None Family Consequences:  None Blackouts:  None, reports that she has only experienced blackouts due to her elevated blood pressure DT's: None Withdrawal  Symptoms:   None  Past Medical History:  Past Medical History:  Diagnosis Date   Anemia    Essential hypertension 07/16/2020   Medical history non-contributory    Obesity 07/16/2020   Sickle cell trait (HCC)    Syncope and collapse 07/16/2020    Past Surgical History:  Procedure Laterality Date   CESAREAN SECTION N/A 05/23/2014   Procedure: CESAREAN SECTION;  Surgeon: Genia Del, MD;  Location: [facility];  Service: Obstetrics;  Laterality: N/A;   cyst removal on left wrist     GANGLION CYST EXCISION     x2, left wrist   INDUCED ABORTION      Family Psychiatric History:  Great great aunt (maternal) - Schizophrenia  Family History:  Family History  Problem Relation Age of Onset   Healthy Mother    Healthy Father    Sickle cell trait Sister    Hypertension Cousin    Hearing loss Neg Hx     Social History:   Social History   Socioeconomic History   Marital status: Single    Spouse name: Not on file   Number of children: Not on file   Years of education: Not on file   Highest education level: Not on file  Occupational History   Not on file  Tobacco Use   Smoking status: Never   Smokeless tobacco: Never  Substance and Sexual Activity   Alcohol use: Yes   Drug use: No   Sexual activity: Yes  Other Topics Concern   Not on file  Social History Narrative   Not on file   Social Determinants of Health   Financial Resource Strain: Not on file  Food Insecurity: Not on file  Transportation Needs: Not on file  Physical Activity: Not on file  Stress: Not on file  Social Connections: Not on file    Additional Social History:  Patient is currently working as a Runner, 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  Patient also endorses transportation.  Patient denies having social supports stating that she has lashed out at many people since dealing with her symptoms.  Patient is interested in seeking therapy  services.  Allergies:  No Known Allergies  Metabolic Disorder Labs: No results found for: "HGBA1C", "MPG" No results found for: "PROLACTIN" No results found for: "CHOL", "TRIG", "HDL", "CHOLHDL", "VLDL", "LDLCALC" No results found for: "TSH"  Therapeutic Level Labs: No results found for: "LITHIUM" No results found for: "CBMZ" No results found for: "VALPROATE"  Current Medications: Current Outpatient Medications  Medication Sig Dispense Refill   amLODipine (NORVASC) 5 MG tablet Take 1 tablet (5 mg total) by mouth daily. Must call to schedule appointment for refills. 15 tablet 0   divalproex (DEPAKOTE) 500 MG DR tablet Take 1 tablet (500 mg total) by mouth 2 (two) times daily. 60 tablet 2   escitalopram (LEXAPRO) 10 MG tablet Take 15 mg by mouth daily.     ferrous sulfate 325 (65 FE) MG tablet Take 1 tablet (325 mg total) by mouth 2 (two) times daily with a meal. 60 tablet 3   hydrochlorothiazide (HYDRODIURIL) 25 MG tablet Take 25 mg by mouth daily.     hydrOXYzine (ATARAX/VISTARIL) 25 MG tablet Take 1 tablet (25 mg total) by mouth every 6 (six) hours as needed for itching. 30 tablet 0   JUNEL FE 1/20 1-20 MG-MCG tablet Take 1 tablet by mouth daily.     permethrin (ELIMITE) 5 % cream Apply from neck down.  Leave in place 12 hours, then rinse off.  Repeat in 1 week if needed. (Patient taking differently: Apply from neck down.  Leave in place 12 hours, then rinse off.  Repeat in 1 week if needed.) 60 g 1   No current facility-administered medications for this visit.    Musculoskeletal: Strength & Muscle Tone: within normal limits Gait & Station: normal Patient leans: N/A  Psychiatric Specialty Exam: Review of Systems  Psychiatric/Behavioral:  Positive for agitation, dysphoric mood and sleep disturbance. Negative for decreased concentration, hallucinations, self-injury and suicidal ideas. The patient is nervous/anxious and is hyperactive.     Blood pressure (!) 149/80, pulse 76,  height 5\' 4"  (1.626 m), weight 211 lb (95.7 kg), unknown if currently breastfeeding.Body mass index is 36.22 kg/m.  General Appearance: Casual  Eye Contact:  Fair  Speech:  Clear and Coherent and Normal Rate  Volume:  Normal  Mood:  Anxious and Irritable  Affect:  Labile and Full Range  Thought Process:  Coherent, Goal Directed, and Descriptions of Associations: Intact  Orientation:  Full (Time, Place, and Person)  Thought Content:  WDL and Ideas of Reference:   Paranoia  Suicidal Thoughts:  No  Homicidal Thoughts:  No  Memory:  Immediate;   Good Recent;   Good Remote;   Good  Judgement:  Fair  Insight:  Lacking  Psychomotor Activity:  Increased  Concentration:  Concentration: Good and Attention Span: Good  Recall:  Good  Fund of Knowledge:Good  Language: Good  Akathisia:  No  Handed:  Right  AIMS (if indicated):  not done  Assets:  Communication Skills Desire for Improvement Housing Transportation Vocational/Educational  ADL's:  Intact  Cognition: WNL  Sleep:  Fair   Screenings: GAD-7    Flowsheet Row Office Visit from 12/15/2021 in [facility]  Total GAD-7 Score 19      PHQ2-9    Flowsheet Row Office Visit from 12/15/2021 in [facility]  PHQ-2 Total Score 2  PHQ-9 Total Score 15      Flowsheet Row Office Visit from 12/15/2021 in [facility] ED from 07/14/2020 in [facility] EMERGENCY DEPARTMENT  C-SSRS RISK CATEGORY No Risk No Risk       Assessment and Plan:   Arleene Settle is a 41 year old female with a past psychiatric history significant for anxiety who presents to [facility] for psychiatric evaluation and medication management.  Patient presented to [facility] yesterday due to mood swings.  She expresses that in addition to anxiety, she has been experiencing racing thoughts.  Patient was also irritable and verbally  combative during the encounter.  Patient is currently taking Lexapro for the management of her anxiety.  Based off of patient's presentation, patient's symptoms are closely related to bipolar disorder.  Patient was recommended being placed on Depakote 500 mg DR 2 times daily for the management of her symptoms.  Patient was informed to discontinuing Lexapro.  Patient was agreeable to recommendations.  Patient's medications to be prescribed to pharmacy of choice.  Collaboration of Care: Medication Management AEB provider managing patient's psychiatric medications, Primary Care Provider AEB patient being seen by a primary care provider, Psychiatrist AEB patient to be followed by mental health provider, and Other provider involved in patient's care AEB patient is being seen by OB/GYN and cardiology  Patient/Guardian was advised Release of Information must be obtained prior to any record release in order to collaborate their care with an outside provider. Patient/Guardian was advised if they have not already done so to contact the registration department to sign all necessary forms in order for [place] to release information regarding their care.   Consent: Patient/Guardian gives verbal consent for treatment and assignment of benefits for services provided during this visit. Patient/Guardian expressed understanding and agreed to proceed.   1. Bipolar affective disorder, currently manic, moderate (HCC)  - divalproex (DEPAKOTE) 500 MG DR tablet; Take 1 tablet (500 mg total) by mouth 2 (two) times daily.  Dispense: 60 tablet; Refill: 2  Since patient has insurance, patient was given resources for outpatient psychiatry Provider spent a total of 40 minutes with the patient/reviewing patient's chart  Meta Hatchet, PA 6/22/20231:51 PM

## 2022-01-04 ENCOUNTER — Other Ambulatory Visit: Payer: Self-pay | Admitting: Obstetrics & Gynecology

## 2022-01-05 ENCOUNTER — Other Ambulatory Visit: Payer: Self-pay | Admitting: Internal Medicine

## 2022-01-05 DIAGNOSIS — E049 Nontoxic goiter, unspecified: Secondary | ICD-10-CM

## 2022-01-09 ENCOUNTER — Ambulatory Visit
Admission: RE | Admit: 2022-01-09 | Discharge: 2022-01-09 | Disposition: A | Discharge status: Home / Self Care | Payer: BC Managed Care – PPO | Source: Ambulatory Visit | Attending: Internal Medicine | Admitting: Internal Medicine

## 2022-01-09 DIAGNOSIS — E049 Nontoxic goiter, unspecified: Secondary | ICD-10-CM

## 2022-01-20 ENCOUNTER — Encounter (HOSPITAL_BASED_OUTPATIENT_CLINIC_OR_DEPARTMENT_OTHER): Payer: Self-pay | Admitting: Obstetrics & Gynecology

## 2022-01-26 ENCOUNTER — Encounter (HOSPITAL_BASED_OUTPATIENT_CLINIC_OR_DEPARTMENT_OTHER): Payer: Self-pay | Admitting: Obstetrics & Gynecology

## 2022-01-26 NOTE — Progress Notes (Signed)
Spoke w/ via phone for pre-op interview--- pt Lab needs dos----   cbc, bmp, urine preg            Lab results------ no COVID test -----patient states asymptomatic no test needed Arrive at ------- 1130 on 01-31-2022 NPO after MN NO Solid Food.  Clear liquids from MN until--- 1030 Med rec completed Medications to take morning of surgery ----- norvasc, depakote Diabetic medication ----- n/a Patient instructed no nail polish to be worn day of surgery Patient instructed to bring photo id and insurance card day of surgery Patient aware to have Driver (ride ) / caregiver for 24 hours after surgery ---mother, Olegario Messier Patient Special Instructions ----- n/a Pre-Op special Istructions ----- n/a Patient verbalized understanding of instructions that were given at this phone interview. Patient denies shortness of breath, chest pain, fever, cough at this phone interview.

## 2022-01-30 NOTE — Anesthesia Preprocedure Evaluation (Signed)
Anesthesia Evaluation  Patient identified by MRN, date of birth, ID band Patient awake    Reviewed: Allergy & Precautions, NPO status , Patient's Chart, lab work & pertinent test results  Airway Mallampati: II  TM Distance: >3 FB Neck ROM: Full    Dental no notable dental hx. (+) Dental Advisory Given, Teeth Intact   Pulmonary neg pulmonary ROS,    Pulmonary exam normal breath sounds clear to auscultation       Cardiovascular hypertension, Normal cardiovascular exam Rhythm:Regular Rate:Normal     Neuro/Psych negative neurological ROS     GI/Hepatic   Endo/Other    Renal/GU      Musculoskeletal   Abdominal (+) + obese (BMI 36.05),   Peds  Hematology   Anesthesia Other Findings   Reproductive/Obstetrics                            Anesthesia Physical Anesthesia Plan  ASA: 2  Anesthesia Plan: General   Post-op Pain Management: Toradol IV (intra-op)* and Tylenol PO (pre-op)*   Induction: Intravenous  PONV Risk Score and Plan: Treatment may vary due to age or medical condition, Midazolam, Dexamethasone and Ondansetron  Airway Management Planned: Oral ETT  Additional Equipment:   Intra-op Plan:   Post-operative Plan: Extubation in OR  Informed Consent: I have reviewed the patients History and Physical, chart, labs and discussed the procedure including the risks, benefits and alternatives for the proposed anesthesia with the patient or authorized representative who has indicated his/her understanding and acceptance.     Dental advisory given  Plan Discussed with:   Anesthesia Plan Comments:        Anesthesia Quick Evaluation

## 2022-01-31 ENCOUNTER — Ambulatory Visit (HOSPITAL_BASED_OUTPATIENT_CLINIC_OR_DEPARTMENT_OTHER): Payer: BC Managed Care – PPO | Admitting: Anesthesiology

## 2022-01-31 ENCOUNTER — Encounter (HOSPITAL_BASED_OUTPATIENT_CLINIC_OR_DEPARTMENT_OTHER): Payer: Self-pay | Admitting: Obstetrics & Gynecology

## 2022-01-31 ENCOUNTER — Other Ambulatory Visit: Payer: Self-pay

## 2022-01-31 ENCOUNTER — Encounter (HOSPITAL_BASED_OUTPATIENT_CLINIC_OR_DEPARTMENT_OTHER)
Admission: RE | Disposition: A | Discharge status: Home / Self Care | Payer: Self-pay | Source: Home / Self Care | Attending: Obstetrics & Gynecology

## 2022-01-31 ENCOUNTER — Ambulatory Visit (HOSPITAL_BASED_OUTPATIENT_CLINIC_OR_DEPARTMENT_OTHER)
Admission: RE | Admit: 2022-01-31 | Discharge: 2022-01-31 | Disposition: A | Discharge status: Home / Self Care | Payer: BC Managed Care – PPO | Attending: Obstetrics & Gynecology | Admitting: Obstetrics & Gynecology

## 2022-01-31 DIAGNOSIS — I10 Essential (primary) hypertension: Secondary | ICD-10-CM | POA: Diagnosis not present

## 2022-01-31 DIAGNOSIS — F319 Bipolar disorder, unspecified: Secondary | ICD-10-CM | POA: Insufficient documentation

## 2022-01-31 DIAGNOSIS — Z01818 Encounter for other preprocedural examination: Secondary | ICD-10-CM

## 2022-01-31 DIAGNOSIS — Z302 Encounter for sterilization: Secondary | ICD-10-CM | POA: Diagnosis present

## 2022-01-31 HISTORY — DX: Generalized anxiety disorder: F41.1

## 2022-01-31 HISTORY — DX: Major depressive disorder, single episode, unspecified: F32.9

## 2022-01-31 HISTORY — DX: Personal history of diseases of the blood and blood-forming organs and certain disorders involving the immune mechanism: Z86.2

## 2022-01-31 HISTORY — DX: Iron deficiency anemia, unspecified: D50.9

## 2022-01-31 HISTORY — PX: LAPAROSCOPIC TUBAL LIGATION: SHX1937

## 2022-01-31 HISTORY — DX: Bipolar disorder, unspecified: F31.9

## 2022-01-31 LAB — BASIC METABOLIC PANEL
Anion gap: 9 (ref 5–15)
BUN: 10 mg/dL (ref 6–20)
CO2: 23 mmol/L (ref 22–32)
Calcium: 9.4 mg/dL (ref 8.9–10.3)
Chloride: 107 mmol/L (ref 98–111)
Creatinine, Ser: 0.68 mg/dL (ref 0.44–1.00)
GFR, Estimated: >60 mL/min (ref >60)
Glucose, Bld: 87 mg/dL (ref 70–99)
Potassium: 3.6 mmol/L (ref 3.5–5.1)
Sodium: 139 mmol/L (ref 135–145)

## 2022-01-31 LAB — CBC
HCT: 41.3 % (ref 36.0–46.0)
Hemoglobin: 13.8 g/dL (ref 12.0–15.0)
MCH: 27.3 pg (ref 26.0–34.0)
MCHC: 33.4 g/dL (ref 30.0–36.0)
MCV: 81.6 fL (ref 80.0–100.0)
Platelets: 326 10*3/uL (ref 150–400)
RBC: 5.06 MIL/uL (ref 3.87–5.11)
RDW: 13.5 % (ref 11.5–15.5)
WBC: 4.9 10*3/uL (ref 4.0–10.5)
nRBC: 0 % (ref 0.0–0.2)

## 2022-01-31 LAB — POCT PREGNANCY, URINE: Preg Test, Ur: NEGATIVE

## 2022-01-31 SURGERY — LIGATION, FALLOPIAN TUBE, LAPAROSCOPIC
Anesthesia: General | Site: Abdomen | Laterality: Bilateral

## 2022-01-31 MED ORDER — LIDOCAINE 2% (20 MG/ML) 5 ML SYRINGE
INTRAMUSCULAR | Status: DC | PRN
  Administered 2022-01-31: 60 mg via INTRAVENOUS

## 2022-01-31 MED ORDER — BUPIVACAINE HCL (PF) 0.25 % IJ SOLN
INTRAMUSCULAR | Status: DC | PRN
  Administered 2022-01-31: 16 mL

## 2022-01-31 MED ORDER — ROCURONIUM BROMIDE 10 MG/ML (PF) SYRINGE
PREFILLED_SYRINGE | INTRAVENOUS | Status: DC | PRN
  Administered 2022-01-31: 50 mg via INTRAVENOUS

## 2022-01-31 MED ORDER — HYDROMORPHONE HCL 1 MG/ML IJ SOLN: 0.2500 mg | INTRAMUSCULAR | Status: DC | PRN

## 2022-01-31 MED ORDER — ACETAMINOPHEN 325 MG PO TABS: 650.0000 mg | ORAL_TABLET | Freq: Four times a day (QID) | ORAL | Status: AC | PRN

## 2022-01-31 MED ORDER — KETOROLAC TROMETHAMINE 30 MG/ML IJ SOLN
INTRAMUSCULAR | Status: DC | PRN
  Administered 2022-01-31: 30 mg via INTRAVENOUS

## 2022-01-31 MED ORDER — FENTANYL CITRATE (PF) 100 MCG/2ML IJ SOLN
INTRAMUSCULAR | Status: AC
  Filled 2022-01-31: qty 2

## 2022-01-31 MED ORDER — CEFAZOLIN SODIUM-DEXTROSE 2-4 GM/100ML-% IV SOLN
2.0000 g | INTRAVENOUS | Status: AC
  Administered 2022-01-31: 2 g via INTRAVENOUS

## 2022-01-31 MED ORDER — ONDANSETRON HCL 4 MG/2ML IJ SOLN
INTRAMUSCULAR | Status: DC | PRN
  Administered 2022-01-31: 4 mg via INTRAVENOUS

## 2022-01-31 MED ORDER — ROCURONIUM BROMIDE 10 MG/ML (PF) SYRINGE
PREFILLED_SYRINGE | INTRAVENOUS | Status: AC
  Filled 2022-01-31: qty 10

## 2022-01-31 MED ORDER — PROPOFOL 10 MG/ML IV BOLUS
INTRAVENOUS | Status: DC | PRN
  Administered 2022-01-31: 150 mg via INTRAVENOUS

## 2022-01-31 MED ORDER — OXYCODONE HCL 5 MG PO TABS: 5.0000 mg | ORAL_TABLET | Freq: Four times a day (QID) | ORAL | 0 refills | Status: AC | PRN

## 2022-01-31 MED ORDER — SUGAMMADEX SODIUM 200 MG/2ML IV SOLN
INTRAVENOUS | Status: DC | PRN
  Administered 2022-01-31: 200 mg via INTRAVENOUS

## 2022-01-31 MED ORDER — BUPIVACAINE HCL (PF) 0.25 % IJ SOLN
INTRAMUSCULAR | Status: AC
  Filled 2022-01-31: qty 30

## 2022-01-31 MED ORDER — DEXMEDETOMIDINE HCL IN NACL 80 MCG/20ML IV SOLN
INTRAVENOUS | Status: AC
  Filled 2022-01-31: qty 20

## 2022-01-31 MED ORDER — IBUPROFEN 200 MG PO TABS: 600.0000 mg | ORAL_TABLET | Freq: Four times a day (QID) | ORAL | 0 refills | Status: AC | PRN

## 2022-01-31 MED ORDER — ONDANSETRON HCL 4 MG/2ML IJ SOLN
INTRAMUSCULAR | Status: AC
  Filled 2022-01-31: qty 2

## 2022-01-31 MED ORDER — DEXAMETHASONE SODIUM PHOSPHATE 10 MG/ML IJ SOLN
INTRAMUSCULAR | Status: AC
  Filled 2022-01-31: qty 1

## 2022-01-31 MED ORDER — DEXMEDETOMIDINE (PRECEDEX) IN NS 20 MCG/5ML (4 MCG/ML) IV SYRINGE
PREFILLED_SYRINGE | INTRAVENOUS | Status: DC | PRN
  Administered 2022-01-31 (×2): 8 ug via INTRAVENOUS

## 2022-01-31 MED ORDER — HYDRALAZINE HCL 20 MG/ML IJ SOLN
10.0000 mg | Freq: Once | INTRAMUSCULAR | Status: AC
  Administered 2022-01-31: 10 mg via INTRAVENOUS

## 2022-01-31 MED ORDER — HYDRALAZINE HCL 20 MG/ML IJ SOLN
INTRAMUSCULAR | Status: AC
  Filled 2022-01-31: qty 1

## 2022-01-31 MED ORDER — ESMOLOL HCL 100 MG/10ML IV SOLN
INTRAVENOUS | Status: DC | PRN
  Administered 2022-01-31: 20 mg via INTRAVENOUS

## 2022-01-31 MED ORDER — MIDAZOLAM HCL 2 MG/2ML IJ SOLN
INTRAMUSCULAR | Status: DC | PRN
  Administered 2022-01-31: 2 mg via INTRAVENOUS

## 2022-01-31 MED ORDER — ONDANSETRON HCL 4 MG/2ML IJ SOLN: 4.0000 mg | Freq: Once | INTRAMUSCULAR | Status: DC | PRN

## 2022-01-31 MED ORDER — POVIDONE-IODINE 10 % EX SWAB: 2.0000 | Freq: Once | CUTANEOUS | Status: DC

## 2022-01-31 MED ORDER — OXYCODONE HCL 5 MG PO TABS: 5.0000 mg | ORAL_TABLET | Freq: Once | ORAL | Status: DC | PRN

## 2022-01-31 MED ORDER — CEFAZOLIN SODIUM-DEXTROSE 2-4 GM/100ML-% IV SOLN
INTRAVENOUS | Status: AC
  Filled 2022-01-31: qty 100

## 2022-01-31 MED ORDER — DEXAMETHASONE SODIUM PHOSPHATE 10 MG/ML IJ SOLN
INTRAMUSCULAR | Status: DC | PRN
  Administered 2022-01-31 (×2): 5 mg via INTRAVENOUS

## 2022-01-31 MED ORDER — KETOROLAC TROMETHAMINE 30 MG/ML IJ SOLN: 30.0000 mg | Freq: Once | INTRAMUSCULAR | Status: DC | PRN

## 2022-01-31 MED ORDER — LACTATED RINGERS IV SOLN: INTRAVENOUS | Status: DC

## 2022-01-31 MED ORDER — OXYCODONE HCL 5 MG/5ML PO SOLN: 5.0000 mg | Freq: Once | ORAL | Status: DC | PRN

## 2022-01-31 MED ORDER — KETOROLAC TROMETHAMINE 30 MG/ML IJ SOLN
INTRAMUSCULAR | Status: AC
  Filled 2022-01-31: qty 1

## 2022-01-31 MED ORDER — FENTANYL CITRATE (PF) 100 MCG/2ML IJ SOLN
INTRAMUSCULAR | Status: DC | PRN
  Administered 2022-01-31 (×2): 50 ug via INTRAVENOUS

## 2022-01-31 MED ORDER — MIDAZOLAM HCL 2 MG/2ML IJ SOLN
INTRAMUSCULAR | Status: AC
  Filled 2022-01-31: qty 2

## 2022-01-31 SURGICAL SUPPLY — 26 items
ADH SKN CLS APL DERMABOND .7 (GAUZE/BANDAGES/DRESSINGS) ×1
CATH ROBINSON RED A/P 16FR (CATHETERS) ×3 IMPLANT
COVER MAYO STAND STRL (DRAPES) ×3 IMPLANT
DERMABOND ADVANCED (GAUZE/BANDAGES/DRESSINGS) ×1
DERMABOND ADVANCED .7 DNX12 (GAUZE/BANDAGES/DRESSINGS) ×2 IMPLANT
DRSG OPSITE POSTOP 3X4 (GAUZE/BANDAGES/DRESSINGS) IMPLANT
DURAPREP 26ML APPLICATOR (WOUND CARE) ×3 IMPLANT
GAUZE 4X4 16PLY ~~LOC~~+RFID DBL (SPONGE) ×4 IMPLANT
GLOVE BIO SURGEON STRL SZ7 (GLOVE) ×3 IMPLANT
GLOVE BIOGEL PI IND STRL 7.0 (GLOVE) ×6 IMPLANT
GLOVE BIOGEL PI INDICATOR 7.0 (GLOVE) ×3
GOWN STRL REUS W/TWL LRG LVL3 (GOWN DISPOSABLE) ×4 IMPLANT
KIT TURNOVER CYSTO (KITS) ×3 IMPLANT
LIGASURE VESSEL 5MM BLUNT TIP (ELECTROSURGICAL) ×3 IMPLANT
PACK LAPAROSCOPY BASIN (CUSTOM PROCEDURE TRAY) ×3 IMPLANT
PACK TRENDGUARD 450 HYBRID PRO (MISCELLANEOUS) IMPLANT
PAD OB MATERNITY 4.3X12.25 (PERSONAL CARE ITEMS) ×3 IMPLANT
PROTECTOR NERVE ULNAR (MISCELLANEOUS) ×4 IMPLANT
SET TUBE SMOKE EVAC HIGH FLOW (TUBING) ×3 IMPLANT
SUT VIC AB 4-0 PS2 18 (SUTURE) ×3 IMPLANT
SUT VICRYL 0 UR6 27IN ABS (SUTURE) ×3 IMPLANT
TOWEL OR 17X26 10 PK STRL BLUE (TOWEL DISPOSABLE) ×3 IMPLANT
TRENDGUARD 450 HYBRID PRO PACK (MISCELLANEOUS)
TROCAR BALLN 12MMX100 BLUNT (TROCAR) ×3 IMPLANT
TROCAR Z-THREAD FIOS 5X100MM (TROCAR) ×3 IMPLANT
WARMER LAPAROSCOPE (MISCELLANEOUS) ×3 IMPLANT

## 2022-01-31 NOTE — Transfer of Care (Signed)
Immediate Anesthesia Transfer of Care Note  Patient: Melody Wells  Procedure(s) Performed: Procedure(s) (LRB): LAPAROSCOPIC TUBAL LIGATION By Salpingectomy (Bilateral)  Patient Location: PACU  Anesthesia Type: General  Level of Consciousness: awake, oriented, sedated and patient cooperative  Airway & Oxygen Therapy: Patient Spontanous Breathing and Patient connected to face mask oxygen  Post-op Assessment: Report given to PACU RN and Post -op Vital signs reviewed and stable  Post vital signs: Reviewed and stable  Complications: No apparent anesthesia complications Last Vitals:  Vitals Value Taken Time  BP 156/103 01/31/22 1519  Temp    Pulse 80 01/31/22 1521  Resp 19 01/31/22 1521  SpO2 96 % 01/31/22 1521  Vitals shown include unvalidated device data.  Last Pain:  Vitals:   01/31/22 1154  TempSrc: Oral  PainSc: 0-No pain      Patients Stated Pain Goal: 5 (01/31/22 1154)  Complications: No notable events documented.

## 2022-01-31 NOTE — Discharge Instructions (Signed)
DISCHARGE INSTRUCTIONS: Laparoscopy  The following instructions have been prepared to help you care for yourself upon your return home today.  Wound care:  Do not get the incision wet for the first 24 hours. The incision should be kept clean and dry.  The Band-Aids or dressings may be removed the day after surgery.  Should the incision become sore, red, and swollen after the first week, check with your doctor.  Personal hygiene:  Shower the day after your procedure.  Activity and limitations:  Do NOT drive or operate any equipment today.  Do NOT lift anything more than 15 pounds for 2-3 weeks after surgery.  Do NOT rest in bed all day.  Walking is encouraged. Walk each day, starting slowly with 5-minute walks 3 or 4 times a day. Slowly increase the length of your walks.  Walk up and down stairs slowly.  Do NOT do strenuous activities, such as golfing, playing tennis, bowling, running, biking, weight lifting, gardening, mowing, or vacuuming for 2-4 weeks. Ask your doctor when it is okay to start.  Diet: Eat a light meal as desired this evening. You may resume your usual diet tomorrow.  Return to work: This is dependent on the type of work you do. For the most part you can return to a desk job within a week of surgery. If you are more active at work, please discuss this with your doctor.  What to expect after your surgery: You may have a slight burning sensation when you urinate on the first day. You may have a very small amount of blood in the urine. Expect to have a small amount of vaginal discharge/light bleeding for 1-2 weeks. It is not unusual to have abdominal soreness and bruising for up to 2 weeks. You may be tired and need more rest for about 1 week. You may experience shoulder pain for 24-72 hours. Lying flat in bed may relieve it.  Call your doctor for any of the following:  Develop a fever of 100.4 or greater  Inability to urinate 6 hours after discharge from hospital  Severe  pain not relieved by pain medications  Persistent of heavy bleeding at incision site  Redness or swelling around incision site after a week  Increasing nausea or vomiting  Post Anesthesia Home Care Instructions  Activity: Get plenty of rest for the remainder of the day. A responsible individual must stay with you for 24 hours following the procedure.  For the next 24 hours, DO NOT: -Drive a car -Paediatric nurse -Drink alcoholic beverages -Take any medication unless instructed by your physician -Make any legal decisions or sign important papers.  Meals: Start with liquid foods such as gelatin or soup. Progress to regular foods as tolerated. Avoid greasy, spicy, heavy foods. If nausea and/or vomiting occur, drink only clear liquids until the nausea and/or vomiting subsides. Call your physician if vomiting continues.  Special Instructions/Symptoms: Your throat may feel dry or sore from the anesthesia or the breathing tube placed in your throat during surgery. If this causes discomfort, gargle with warm salt water. The discomfort should disappear within 24 hours.  If you had a scopolamine patch placed behind your ear for the management of post- operative nausea and/or vomiting:  1. The medication in the patch is effective for 72 hours, after which it should be removed.  Wrap patch in a tissue and discard in the trash. Wash hands thoroughly with soap and water. 2. You may remove the patch earlier than 72 hours  if you experience unpleasant side effects which may include dry mouth, dizziness or visual disturbances. 3. Avoid touching the patch. Wash your hands with soap and water after contact with the patch.     No ibuprofen, Advil, Aleve, Motrin, ketorolac, meloxicam, naproxen, or other NSAIDS until after 09:05pm pm today if needed.

## 2022-01-31 NOTE — Op Note (Signed)
01/31/2022 Melody Wells  Preoperative diagnosis: Multiparity, permanent sterilization desired Postoperative diagnosis: Same Procedure: Laparoscopic bilateral tubal sterilization by bilateral complete salpingectomy  Surgeon: Shea Evans, MD Assistants: none Anesthesia Gen. Endotracheal IV fluids LR 800 cc EBL minimal at 10 cc  Urine clear (straight cath pre-op) 300 cc  Complications none Disposition PACU and home Specimens: right and left fallopian tubes   Procedure Patient is 41 year old female desiring permanent sterilization via tubal sterilization. All options of contraception and sterilization were reviewed.  Patient declined other options. Risk and complications of surgery including infection, bleeding, damage to internal organs, other complications including pneumonia, VTE were reviewed. Also discussed irreversibility as well as failure and risk of ectopic pregnancy. Patient voiced understanding. Informed written consent was obtained.  Patient was brought to the operating room with IV running. Timeout was carried out. She underwent general anesthesia without difficulty and was given dorsal lithotomy position. Examination under anesthesia revealed anteverted 8 week size uterus. Parts prepped and draped in standard fashion. Bladder was emptied with straight catheter with clear urine. Speculum was placed anterior lip of cervix was grasped with tenaculum and Hulk manipulator inserted and secured to cervix. Gown and gloves were changed attention was focused on the abdomen. A 10 mm vertical incision was made at the umbilicus after injecting 10 cc Marcaine. Incision was carried down to the fascia was incised peritoneal entry was confirmed. Stay suture of 0 Vicryl was taken on the fascia and Hassan cannula was introduced. Insufflation was begun with CO2. Patient was given Trendelenburg position. A 0 laparoscope was introduced. Omentum, liver normal. No pelvic adhesions noted. Uterus noted with a  fundal fibroid and both tubes and ovaries normal. No bleeding was noted. One 5 mm trocar inserted in each lower quadrant under vision after skin incision made after injecting 0.25% marcaine. Bowels moved out of the pelvis. Ligasure used for complete salpingectomy. Right fallopian tube elevated with Marylene through left lower port and Ligasure through right lower port, salpingectomy performed with excellent hemostasis. Then left salpingectomy performed. Both tubes removed through central port. Hemostasis noted. Both lower trocars removed. Pneumoperitoneum deflated and central port and laparoscope removed. Stay suture at the fascia were tied off with adequate closure. All skin incisions was approximated using 4-0 Vicryl in subcuticular fashion. Dermabond was applied at the incision. Tenaculum and Hulka removed. Hemostasis was excellent..  All counts were correct x2  Patient was reversed from general anesthesia extubated and brought out to the recovery room in stable condition. Plan is to discharge home from recovery room. Surgical findings were discussed with patient's family. Followup with Dr. Juliene Pina in office in 2 weeks. I performed this surgery --Shea Evans MD  Rocco Pauls

## 2022-01-31 NOTE — H&P (Signed)
Melody Wells is an 41 y.o. female here for surgery for permanent sterilization due to not wanting any more pregnancies due to episodes of worsening hypertension and recent bipolar diagnosis   She was Hypertensive crisis on 6/19 and was in ER . Then went to Mental health facility and is diagnosed w/ Bipolar disorder. Is worried about teratogenic effect of Psych med and wants TL./ permanent sterilization. she is on Depakot and wants to continue  labs nl at PCP incl kidney tests. Working on diet for cholesterol. Sees a Cardiologist off and on since HTN crisis w/ ED visit in Jan'22 . needs to follow up and discuss BP  Management  [id number]. One C/s and one TAB Regular menses. last Pap Oct'20, nl. No breast problems, just turned 41 and needs mammogram.  Patient's last menstrual period was 01/31/2022 (approximate).    Past Medical History:  Diagnosis Date   Bipolar affective disorder (HCC)    Essential hypertension 07/16/2020   GAD (generalized anxiety disorder)    History of iron deficiency anemia    History of syncope 07/14/2020   ED visit in epic  in setting of severe headache and had not long started HCTZ;  evaluated by cardiology-- dr t [facility] 07-16-2020  felt to be vagal in nature trigger by severe headache   MDD (major depressive disorder)    Sickle cell trait (HCC)     Past Surgical History:  Procedure Laterality Date   CESAREAN SECTION N/A 05/23/2014   Procedure: CESAREAN SECTION;  Surgeon: Genia Del, MD;  Location: [facility];  Service: Obstetrics;  Laterality: N/A;   WRIST GANGLION EXCISION Left 03/12/2005   @[facility]  by dr syhper;   aggresive debridement and repair dorsal wrist capsule, ganglion/ myxoma   WRIST MASS EXCISION Left 05/31/2006   @[facility]  by dr sypher;   exploration and excision recurrent mas  (myxoma)    Family History  Problem Relation Age of Onset   Healthy Mother    Healthy Father    Sickle cell trait Sister    Hypertension Cousin    Hearing loss Neg Hx      Social History:  reports that she has never smoked. She has never used smokeless tobacco. She reports current alcohol use. She reports that she does not use drugs.  Allergies: No Known Allergies  Medications Prior to Admission  Medication Sig Dispense Refill Last Dose   amLODipine (NORVASC) 10 MG tablet Take 10 mg by mouth daily.   01/29/2022   divalproex (DEPAKOTE) 500 MG DR tablet Take 1 tablet (500 mg total) by mouth 2 (two) times daily. (Patient taking differently: Take 500 mg by mouth 2 (two) times daily.) 60 tablet 2 Past Week   losartan (COZAAR) 50 MG tablet Take 50 mg by mouth daily.   01/29/2022    Review of Systems  Blood pressure (!) 157/101, pulse 71, temperature 98 F (36.7 C), temperature source Oral, resp. rate 17, height 5\' 4"  (1.626 m), weight 97.8 kg, last menstrual period 01/31/2022, SpO2 98 %, unknown if currently breastfeeding.  Physical Exam A&O x 3, no acute distress. Pleasant HEENT neg, no thyromegaly Lungs CTA bilat CV RRR, S1S2 normal Abdo soft, non tender, non acute Extr no edema/ tenderness Pelvic cx closed long. Normal uterus. Normal adnexa \  Results for orders placed or performed during the hospital encounter of 01/31/22 (from the past 24 hour(s))  Pregnancy, urine POC     Status: None   Collection Time: 01/31/22 11:41 AM  Result Value Ref Range  Preg Test, Ur NEGATIVE NEGATIVE  CBC     Status: None   Collection Time: 01/31/22 12:00 PM  Result Value Ref Range   WBC 4.9 4.0 - 10.5 K/uL   RBC 5.06 3.87 - 5.11 MIL/uL   Hemoglobin 13.8 12.0 - 15.0 g/dL   HCT 11.9 41.7 - 40.8 %   MCV 81.6 80.0 - 100.0 fL   MCH 27.3 26.0 - 34.0 pg   MCHC 33.4 30.0 - 36.0 g/dL   RDW 14.4 81.8 - 56.3 %   Platelets 326 150 - 400 K/uL   nRBC 0.0 0.0 - 0.2 %    No results found.  Assessment/Plan: 41 yo female requesting permanent sterilization with laparoscopic bilateral salpingectomy   Risk of regret and irreversibility dw pt  Risks/complications of surgery  reviewed incl infection, bleeding, damage to internal organs including bladder, bowels, ureters, blood vessels, other risks from anesthesia, VTE and delayed complications of any surgery, complications in future surgery reviewed. Also discussed neonatal complications incl difficult delivery, laceration, vacuum assistance, TTN etc. Pt understands and agrees, all concerns addressed.     Robley Fries 01/31/2022, 12:33 PM

## 2022-01-31 NOTE — Anesthesia Procedure Notes (Signed)
Procedure Name: Intubation Date/Time: 01/31/2022 2:17 PM  Performed by: Suan Halter, CRNAPre-anesthesia Checklist: Patient identified, Emergency Drugs available, Suction available and Patient being monitored Patient Re-evaluated:Patient Re-evaluated prior to induction Oxygen Delivery Method: Circle system utilized Preoxygenation: Pre-oxygenation with 100% oxygen Induction Type: IV induction Ventilation: Mask ventilation without difficulty Laryngoscope Size: Mac and 3 Grade View: Grade I Tube type: Oral Tube size: 7.0 mm Number of attempts: 1 Airway Equipment and Method: Stylet and Oral airway Placement Confirmation: ETT inserted through vocal cords under direct vision, positive ETCO2 and breath sounds checked- equal and bilateral Secured at: 22 cm Tube secured with: Tape Dental Injury: Teeth and Oropharynx as per pre-operative assessment

## 2022-02-01 NOTE — Anesthesia Postprocedure Evaluation (Signed)
Anesthesia Post Note  Patient: Melody Wells  Procedure(s) Performed: LAPAROSCOPIC TUBAL LIGATION By Salpingectomy (Bilateral: Abdomen)     Patient location during evaluation: PACU Anesthesia Type: General Level of consciousness: awake and alert Pain management: pain level controlled Vital Signs Assessment: post-procedure vital signs reviewed and stable Respiratory status: spontaneous breathing, nonlabored ventilation, respiratory function stable and patient connected to nasal cannula oxygen Cardiovascular status: blood pressure returned to baseline and stable Postop Assessment: no apparent nausea or vomiting Anesthetic complications: no   No notable events documented.  Last Vitals:  Vitals:   01/31/22 1615 01/31/22 1645  BP: (!) 149/94 (!) 146/96  Pulse: 75 78  Resp: 13 16  Temp: 36.9 C 36.9 C  SpO2: 100% 99%    Last Pain:  Vitals:   01/31/22 1645  TempSrc:   PainSc: 2                  Trevor Iha

## 2022-02-02 ENCOUNTER — Encounter (HOSPITAL_BASED_OUTPATIENT_CLINIC_OR_DEPARTMENT_OTHER): Payer: Self-pay | Admitting: Obstetrics & Gynecology

## 2022-02-02 LAB — SURGICAL PATHOLOGY

## 2022-02-16 ENCOUNTER — Ambulatory Visit (HOSPITAL_COMMUNITY): Payer: BC Managed Care – PPO | Admitting: Psychiatry

## 2023-01-23 ENCOUNTER — Other Ambulatory Visit (HOSPITAL_COMMUNITY): Payer: Self-pay | Admitting: Physician Assistant

## 2023-01-23 DIAGNOSIS — F3112 Bipolar disorder, current episode manic without psychotic features, moderate: Secondary | ICD-10-CM

## 2023-02-02 ENCOUNTER — Encounter (HOSPITAL_COMMUNITY): Payer: Self-pay | Admitting: Psychiatry

## 2023-02-02 ENCOUNTER — Telehealth (HOSPITAL_BASED_OUTPATIENT_CLINIC_OR_DEPARTMENT_OTHER): Payer: BC Managed Care – PPO | Admitting: Psychiatry

## 2023-02-02 VITALS — Wt 220.0 lb

## 2023-02-02 DIAGNOSIS — F3112 Bipolar disorder, current episode manic without psychotic features, moderate: Secondary | ICD-10-CM | POA: Diagnosis not present

## 2023-02-02 DIAGNOSIS — F419 Anxiety disorder, unspecified: Secondary | ICD-10-CM | POA: Diagnosis not present

## 2023-02-02 DIAGNOSIS — F1010 Alcohol abuse, uncomplicated: Secondary | ICD-10-CM | POA: Diagnosis not present

## 2023-02-02 MED ORDER — HYDROXYZINE PAMOATE 25 MG PO CAPS: 100.0000 mg | ORAL_CAPSULE | Freq: Two times a day (BID) | ORAL | 0 refills | Status: AC | PRN

## 2023-02-02 MED ORDER — ARIPIPRAZOLE 5 MG PO TABS: ORAL_TABLET | ORAL | 0 refills | Status: DC

## 2023-02-02 NOTE — Progress Notes (Addendum)
Psychiatric Initial Adult Assessment   Virtual Visit via Video Note  I connected with Melody Wells on 02/02/23 at 11:00 AM EDT by a video enabled telemedicine application and verified that I am speaking with the correct person using two identifiers.  Location: Patient: Home Provider: Home Office   I discussed the limitations of evaluation and management by telemedicine and the availability of in person appointmen  Patient Identification: Melody Wells MRN:  [id number] Date of Evaluation:  02/02/2023 Referral Source: Behavioral health urgent care Chief Complaint:   Chief Complaint  Patient presents with   Establish Care   Anorexia   Visit Diagnosis:    ICD-10-CM   1. Bipolar affective disorder, currently manic, moderate (HCC)  F31.12 ARIPiprazole (ABILIFY) 5 MG tablet    2. Anxiety  F41.9     3. ETOH abuse  F10.10       History of Present Illness: Patient is 42 year old African-American with a past history of mood disorder and anxiety is referred from behavioral health urgent care.  She saw last year.  She admitted not compliant with the Depakote.  She also not taking her blood pressure medicine.  She reported a lot of stress as switching school.  She gets irritable, angry.  She lives with her 60 year old son.  She does not sleep very well.  Lately she noticed started drinking more than usual.  She does not feel the Depakote helps as much.  She denies any sedation, paranoia, anger or any suicidal thoughts.  She was given the diagnosis of bipolar but she is not sure about the diagnosis.  In 2023 she was seen in emergency room and found to be extremely agitated, defensive and mentioned that how black people have to work and times more than by people.  She is close to follow up with outpatient but she never kept appointment.  She is a Runner, broadcasting/film/video at [employer].  Sometimes she feels there is no energy or motivation to do things.  She has gained weight because she is not active and  has no desire to do things.  She like to try a different medication.  Associated Signs/Symptoms: Depression Symptoms:  depressed mood, insomnia, psychomotor agitation, anxiety, (Hypo) Manic Symptoms:  Labiality of Mood, Anxiety Symptoms:  Excessive Worry, Psychotic Symptoms:   denies PTSD Symptoms: Patient told death of the grandfather was very difficult who died unexpectedly.  Denies any nightmares or flashback.  Past Psychiatric History: No history of suicidal attempt but seen in the emergency room twice because of psychotic symptoms.  Given the diagnosis of bipolar disorder and prescribed Depakote but not consistent.  Also tried Lexapro but do not remember the details history of alcohol abuse.  She admitted at least 2 mixed drinks every day.  No history of seizures, blackouts  Previous Psychotropic Medications: Yes   Substance Abuse History in the last 12 months:  Yes.    Consequences of Substance Abuse: History of alcohol use  Past Medical History:  Past Medical History:  Diagnosis Date   Bipolar affective disorder (HCC)    Essential hypertension 07/16/2020   GAD (generalized anxiety disorder)    History of iron deficiency anemia    History of syncope 07/14/2020   ED visit in epic  in setting of severe headache and had not long started HCTZ;  evaluated by cardiology-- dr t [facility] 07-16-2020  felt to be vagal in nature trigger by severe headache   MDD (major depressive disorder)    Sickle cell trait (HCC)  Past Surgical History:  Procedure Laterality Date   CESAREAN SECTION N/A 05/23/2014   Procedure: CESAREAN SECTION;  Surgeon: Genia Del, MD;  Location: [facility];  Service: Obstetrics;  Laterality: N/A;   LAPAROSCOPIC TUBAL LIGATION Bilateral 01/31/2022   Procedure: LAPAROSCOPIC TUBAL LIGATION By Salpingectomy;  Surgeon: Shea Evans, MD;  Location: [facility];  Service: Gynecology;  Laterality: Bilateral;   WRIST GANGLION EXCISION Left 03/12/2005    @[facility]  by dr syhper;   aggresive debridement and repair dorsal wrist capsule, ganglion/ myxoma   WRIST MASS EXCISION Left 05/31/2006   @[facility]  by dr sypher;   exploration and excision recurrent mas  (myxoma)    Family Psychiatric History: Reviewed  Family History:  Family History  Problem Relation Age of Onset   Healthy Mother    Healthy Father    Sickle cell trait Sister    Hypertension Cousin    Hearing loss Neg Hx     Social History:   Social History   Socioeconomic History   Marital status: Single    Spouse name: Not on file   Number of children: Not on file   Years of education: Not on file   Highest education level: Not on file  Occupational History   Not on file  Tobacco Use   Smoking status: Never   Smokeless tobacco: Never  Vaping Use   Vaping status: Never Used  Substance and Sexual Activity   Alcohol use: Yes    Comment: social   Drug use: Never   Sexual activity: Yes    Birth control/protection: None  Other Topics Concern   Not on file  Social History Narrative   Not on file   Social Determinants of Health   Financial Resource Strain: Not on file  Food Insecurity: Not on file  Transportation Needs: Not on file  Physical Activity: Not on file  Stress: Not on file  Social Connections: Unknown (10/28/2021)   [facility]   Social Network    Social Network: Not on file    Additional Social History: Patient moved from [place] at age 66.  Siblings live close by.  He has a 72-year-old son.  She had a good support from her coworkers  Allergies:  No Known Allergies  Metabolic Disorder Labs: No results found for: "HGBA1C", "MPG" No results found for: "PROLACTIN" No results found for: "CHOL", "TRIG", "HDL", "CHOLHDL", "VLDL", "LDLCALC" No results found for: "TSH"  Therapeutic Level Labs: No results found for: "LITHIUM" No results found for: "CBMZ" No results found for: "VALPROATE"  Current Medications: Current  Outpatient Medications  Medication Sig Dispense Refill   acetaminophen (TYLENOL) 325 MG tablet Take 2 tablets (650 mg total) by mouth every 6 (six) hours as needed.     amLODipine (NORVASC) 10 MG tablet Take 10 mg by mouth daily.     divalproex (DEPAKOTE) 500 MG DR tablet Take 1 tablet (500 mg total) by mouth 2 (two) times daily. (Patient taking differently: Take 500 mg by mouth 2 (two) times daily.) 60 tablet 2   ibuprofen (MOTRIN IB) 200 MG tablet Take 3 tablets (600 mg total) by mouth every 6 (six) hours as needed. 30 tablet 0   losartan (COZAAR) 50 MG tablet Take 50 mg by mouth daily.     No current facility-administered medications for this visit.    Musculoskeletal: Strength & Muscle Tone: within normal limits Gait & Station: normal Patient leans: N/A  Psychiatric Specialty Exam: Review of Systems  Weight  220 lb (99.8 kg), unknown if currently breastfeeding.There is no height or weight on file to calculate BMI.  General Appearance: Casual  Eye Contact:  Good  Speech:  Slow  Volume:  Decreased  Mood:  Anxious and Depressed  Affect:  Congruent  Thought Process:  Goal Directed  Orientation:  Full (Time, Place, and Person)  Thought Content:  Rumination  Suicidal Thoughts:  No  Homicidal Thoughts:  No  Memory:  Immediate;   Good Recent;   Good Remote;   Good  Judgement:  Fair  Insight:  Fair  Psychomotor Activity:  Decreased  Concentration:  Concentration: Fair and Attention Span: Fair  Recall:  Good  Fund of Knowledge:Good  Language: Good  Akathisia:  No  Handed:  Right  AIMS (if indicated):  not done  Assets:  Communication Skills Desire for Improvement Financial Resources/Insurance Transportation  ADL's:  Intact  Cognition: WNL  Sleep:  Fair   Screenings: GAD-7    Garment/textile technologist Visit from 12/15/20[facility] in [facility]  Total GAD-7 Score 19      PHQ2-9    Flowsheet Row Office Visit from 12/15/2021 in [facility]  PHQ-2 Total Score 2  PHQ-9 Total Score 15      Flowsheet Row Admission (Discharged) from 01/31/2022 in [facility] Visit from [facility]15/2021 in [facility] ED from 07/14/2020 in [facility] Emergency Department at [facility]  C-SSRS RISK CATEGORY No Risk No Risk No Risk       Assessment and Plan: Patient is 42 year old African-American female with a history of mood symptoms and seen twice at behavioral health urgent care outpatient but not consistent with follow-up and medication.  She also not taking her pressure medicine.  I reviewed chronic symptoms which lately getting worse.  She is not sleeping well and having irritability, mood swings.  She also pick up more alcohol.  She had never tried Abilify before.  I will start 2.5 mg for 1 week and then 5 mg daily.  I will also add low-dose hydroxyzine to help with anxiety.  Discussed need to stop the alcohol as medicine may not work very well.  Encouraged to keep appointment with primary care to start taking blood pressure medicine.  We will refer her for psychotherapy.  Discussed safety concerns and any time having it active suicidal thoughts or homicidal halogen need to call 911 or go to local emergency room.  Collaboration of Care: Other provider involved in patient's care AEB notes are available in epic to review  Patient/Guardian was advised Release of Information must be obtained prior to any record release in order to collaborate their care with an outside provider. Patient/Guardian was advised if they have not already done so to contact the registration department to sign all necessary forms in order for [place] to release information regarding their care.   Consent: Patient/Guardian gives verbal consent for treatment and assignment of benefits for services provided during this visit. Patient/Guardian expressed understanding and agreed to proceed.     Follow Up Instructions:    I  discussed the assessment and treatment plan with the patient. The patient was provided an opportunity to ask questions and all were answered. The patient agreed with the plan and demonstrated an understanding of the instructions.   The patient was advised to call back or seek an in-person evaluation if the symptoms worsen or if the condition fails to improve as anticipated.  I provided 46  minutes of non-face-to-face time during this encounter.  Cleotis Nipper, MD 8/9/202411:05 AM

## 2023-02-27 ENCOUNTER — Other Ambulatory Visit (HOSPITAL_COMMUNITY): Payer: Self-pay | Admitting: Psychiatry

## 2023-02-27 DIAGNOSIS — F3112 Bipolar disorder, current episode manic without psychotic features, moderate: Secondary | ICD-10-CM

## 2023-03-19 ENCOUNTER — Encounter (HOSPITAL_COMMUNITY): Payer: Self-pay | Admitting: Psychiatry

## 2023-03-19 ENCOUNTER — Telehealth (HOSPITAL_BASED_OUTPATIENT_CLINIC_OR_DEPARTMENT_OTHER): Payer: BC Managed Care – PPO | Admitting: Psychiatry

## 2023-03-19 VITALS — Wt 220.0 lb

## 2023-03-19 DIAGNOSIS — F3112 Bipolar disorder, current episode manic without psychotic features, moderate: Secondary | ICD-10-CM

## 2023-03-19 DIAGNOSIS — F419 Anxiety disorder, unspecified: Secondary | ICD-10-CM | POA: Diagnosis not present

## 2023-03-19 DIAGNOSIS — F1010 Alcohol abuse, uncomplicated: Secondary | ICD-10-CM | POA: Diagnosis not present

## 2023-03-19 MED ORDER — ARIPIPRAZOLE 5 MG PO TABS
ORAL_TABLET | ORAL | 1 refills | Status: AC
Start: 2023-03-19 — End: ?

## 2023-03-19 NOTE — Progress Notes (Signed)
[facility] MD Virtual Progress Note   Patient Location: Work Provider Location: Home Office  I connect with patient by video and verified that I am speaking with correct person by using two identifiers. I discussed the limitations of evaluation and management by telemedicine and the availability of in person appointments. I also discussed with the patient that there may be a patient responsible charge related to this service. The patient expressed understanding and agreed to proceed.  Melody Wells [id number] 42 y.o.  03/19/2023 3:07 PM  History of Present Illness:  Patient is 42 year old African-American employed female who was seen first time 6 weeks ago.  She has given the history of bipolar disorder and she was seen twice at behavioral health urgent care.  She is noncompliant with Depakote and restarted her on Abilify.  She reported job is stressful but manageable.  Since started the Abilify she noticed her mood is somewhat better.  She cut down her drinking.  She is not as angry or irritable.  We also provided hydroxyzine but she is not taking it.  She reported going to sleep is not as bad but staying asleep is sometimes difficult.  Patient lives with her 22-year-old son.  She denies any hallucination, paranoia, crying spells or any feeling of hopelessness or worthlessness.  She denies any aggression, violence.  She has appointment coming up later this afternoon to see her primary care.  She is prescribed amlodipine and losartan but only taking amlodipine.  She will clarify with her primary care if she need to take both medicine.  We have referred her for therapy but so far she has not scheduled appointment but like to get some names.  Her chronic symptoms are lack of fatigue, motivation but she also reported recently given the diagnosis of fibroid.  She has low iron.  She is working with her OB/GYN.  So far no major concern from Abilify.  She is taking 5 mg after started 2.5 for 1  week.  Past Psychiatric History: H/O visit to emergency room twice due to suicidal ideation but no attempt. because of psychotic symptoms. Given the diagnosis of bipolar disorder and prescribed Depakote but not consistent. Also tried Lexapro but do not remember the details history of alcohol abuse. She admitted at least 2 mixed drinks every day. No history of seizures, blackouts    Outpatient Encounter Medications as of 03/19/2023  Medication Sig   acetaminophen (TYLENOL) 325 MG tablet Take 2 tablets (650 mg total) by mouth every 6 (six) hours as needed.   amLODipine (NORVASC) 10 MG tablet Take 10 mg by mouth daily.   ARIPiprazole (ABILIFY) 5 MG tablet Take 1/2 tab daily for one week and than full tabdaily   divalproex (DEPAKOTE) 500 MG DR tablet Take 1 tablet (500 mg total) by mouth 2 (two) times daily.   hydrOXYzine (VISTARIL) 25 MG capsule Take 4 capsules (100 mg total) by mouth 2 (two) times daily as needed for itching or anxiety.   ibuprofen (MOTRIN IB) 200 MG tablet Take 3 tablets (600 mg total) by mouth every 6 (six) hours as needed.   losartan (COZAAR) 50 MG tablet Take 50 mg by mouth daily.   No facility-administered encounter medications on file as of 03/19/2023.    No results found for this or any previous visit (from the past 2160 hour(s)).   Psychiatric Specialty Exam: Physical Exam  Review of Systems  unknown if currently breastfeeding.Body mass index is 37.76 kg/m.  General Appearance: Casual  Eye  Contact:  Fair  Speech:  Slow  Volume:  Decreased  Mood:  Euthymic  Affect:  Congruent  Thought Process:  Goal Directed  Orientation:  Full (Time, Place, and Person)  Thought Content:  Rumination  Suicidal Thoughts:  No  Homicidal Thoughts:  No  Memory:  Immediate;   Good Recent;   Good Remote;   Good  Judgement:  Intact  Insight:  Present  Psychomotor Activity:  Decreased  Concentration:  Concentration: Fair and Attention Span: Fair  Recall:  Good  Fund of  Knowledge:  Good  Language:  Good  Akathisia:  No  Handed:  Right  AIMS (if indicated):     Assets:  Communication Skills Desire for Improvement Housing Talents/Skills Transportation  ADL's:  Intact  Cognition:  WNL  Sleep:  fair     Assessment/Plan: Bipolar affective disorder, currently manic, moderate (HCC) - Plan: ARIPiprazole (ABILIFY) 5 MG tablet  Anxiety - Plan: ARIPiprazole (ABILIFY) 5 MG tablet  ETOH abuse - Plan: ARIPiprazole (ABILIFY) 5 MG tablet  Continue Abilify 5 mg daily.  Patient has appointment coming up with primary care 5:00 and she will discuss about her antihypertensive medication.  Patient has question about her follow-up, diagnosis, medication side effects and all these questions were answered.  We recommend to call us back if she feels worsening of symptoms.  She is not taking hydroxyzine but I reminded if she cannot sleep well then she can try at bedtime.  He will also provide referral for therapy.  Patient wondering if her Abilify can be given by primary care and she is still need to see psychiatrist.  I explained if her primary care is comfortable to provide Abilify than she can keep the appointment with primary care and does not need follow-up with psychiatrist.  However it is up to her decision.  Patient acknowledged. Her PCP is not in [facility] I recommended to have her consent if she like to release her information.   Follow Up Instructions:     I discussed the assessment and treatment plan with the patient. The patient was provided an opportunity to ask questions and all were answered. The patient agreed with the plan and demonstrated an understanding of the instructions.   The patient was advised to call back or seek an in-person evaluation if the symptoms worsen or if the condition fails to improve as anticipated.    Collaboration of Care: Other provider involved in patient's care AEB notes are in epic to review  Patient/Guardian was  advised Release of Information must be obtained prior to any record release in order to collaborate their care with an outside provider. Patient/Guardian was advised if they have not already done so to contact the registration department to sign all necessary forms in order for [place] to release information regarding their care.   Consent: Patient/Guardian gives verbal consent for treatment and assignment of benefits for services provided during this visit. Patient/Guardian expressed understanding and agreed to proceed.     I provided 24 minutes of non face to face time during this encounter.  Note: This document was prepared by [employer] voice dictation technology and any errors that results from this process are unintentional.    Cleotis Nipper, MD 03/19/2023

## 2023-03-23 ENCOUNTER — Other Ambulatory Visit: Payer: Self-pay | Admitting: Family Medicine

## 2023-03-23 DIAGNOSIS — E049 Nontoxic goiter, unspecified: Secondary | ICD-10-CM

## 2023-03-29 ENCOUNTER — Ambulatory Visit
Admission: RE | Admit: 2023-03-29 | Discharge: 2023-03-29 | Disposition: A | Discharge status: Home / Self Care | Payer: BC Managed Care – PPO | Source: Ambulatory Visit | Attending: Family Medicine | Admitting: Family Medicine

## 2023-03-29 DIAGNOSIS — E049 Nontoxic goiter, unspecified: Secondary | ICD-10-CM

## 2023-05-03 ENCOUNTER — Other Ambulatory Visit (HOSPITAL_BASED_OUTPATIENT_CLINIC_OR_DEPARTMENT_OTHER): Payer: Self-pay

## 2023-05-03 DIAGNOSIS — G4733 Obstructive sleep apnea (adult) (pediatric): Secondary | ICD-10-CM

## 2023-05-13 ENCOUNTER — Other Ambulatory Visit (HOSPITAL_COMMUNITY): Payer: Self-pay | Admitting: Psychiatry

## 2023-05-13 DIAGNOSIS — F419 Anxiety disorder, unspecified: Secondary | ICD-10-CM

## 2023-05-13 DIAGNOSIS — F1010 Alcohol abuse, uncomplicated: Secondary | ICD-10-CM

## 2023-05-13 DIAGNOSIS — F3112 Bipolar disorder, current episode manic without psychotic features, moderate: Secondary | ICD-10-CM

## 2023-05-21 ENCOUNTER — Encounter (HOSPITAL_COMMUNITY): Payer: Self-pay

## 2023-05-21 ENCOUNTER — Telehealth (HOSPITAL_BASED_OUTPATIENT_CLINIC_OR_DEPARTMENT_OTHER): Payer: Self-pay | Admitting: Psychiatry

## 2023-05-21 DIAGNOSIS — Z91199 Patient's noncompliance with other medical treatment and regimen due to unspecified reason: Secondary | ICD-10-CM

## 2023-05-21 NOTE — Progress Notes (Signed)
Patient is no show for her appointment.

## 2023-06-06 ENCOUNTER — Ambulatory Visit (HOSPITAL_BASED_OUTPATIENT_CLINIC_OR_DEPARTMENT_OTHER): Payer: BC Managed Care – PPO | Attending: Family Medicine | Admitting: Internal Medicine

## 2023-06-06 ENCOUNTER — Encounter (HOSPITAL_BASED_OUTPATIENT_CLINIC_OR_DEPARTMENT_OTHER): Payer: Self-pay

## 2023-06-06 DIAGNOSIS — G4733 Obstructive sleep apnea (adult) (pediatric): Secondary | ICD-10-CM

## 2023-06-13 ENCOUNTER — Ambulatory Visit (HOSPITAL_BASED_OUTPATIENT_CLINIC_OR_DEPARTMENT_OTHER): Payer: BC Managed Care – PPO | Attending: Internal Medicine | Admitting: Internal Medicine

## 2023-08-21 ENCOUNTER — Encounter (HOSPITAL_BASED_OUTPATIENT_CLINIC_OR_DEPARTMENT_OTHER): Payer: Self-pay | Admitting: Internal Medicine

## 2023-08-21 DIAGNOSIS — G4733 Obstructive sleep apnea (adult) (pediatric): Secondary | ICD-10-CM

## 2023-10-21 ENCOUNTER — Ambulatory Visit (HOSPITAL_BASED_OUTPATIENT_CLINIC_OR_DEPARTMENT_OTHER): Payer: Self-pay | Attending: Family Medicine | Admitting: Internal Medicine

## 2023-10-21 VITALS — Ht 64.0 in | Wt 231.0 lb

## 2023-10-21 DIAGNOSIS — G4733 Obstructive sleep apnea (adult) (pediatric): Secondary | ICD-10-CM | POA: Insufficient documentation

## 2023-10-28 DIAGNOSIS — G4733 Obstructive sleep apnea (adult) (pediatric): Secondary | ICD-10-CM

## 2023-10-28 NOTE — Procedures (Signed)
 Melody Wells [facility] [address] [place], [place] [address] Tel: [phone]   Fax: [phone]  Polysomnography Interpretation  Patient Name:  Melody Wells, Melody Wells Date:  10/21/2023 Referring Physician:  Beaulah Bouquet, NP  Indications for Polysomnography The patient is a 43 year-old Female who is 5\' 4"  and weighs 228.0 lbs. Her BMI equals 39.4.  A full night polysomnogram was performed to evaluate for -OSA.  Medication  None reported   Polysomnogram Data A full night polysomnogram recorded the standard physiologic parameters including EEG, EOG, EMG, EKG, nasal and oral airflow.  Respiratory parameters of chest and abdominal movements were recorded with Respiratory Inductance Plethysmography belts.  Oxygen saturation was recorded by pulse oximetry.   Sleep Architecture The total recording time of the polysomnogram was 366.7 minutes.  The total sleep time was 346.5 minutes.  The patient spent 1.9% of total sleep time in Stage N1, 54.8% in Stage N2, 18.2% in Stages N3, and 25.1% in REM.  Sleep latency was 2.5 minutes.  REM latency was 67.5 minutes.  Sleep Efficiency was 94.5%.  Wake after Sleep Onset time was 17.5 minutes.  Respiratory Events The polysomnogram revealed a presence of 30 obstructive, - central, and - mixed apneas resulting in an Apnea index of 5.2 events per hour.  There were 128 hypopneas (>=3% desaturation and/or arousal) resulting in an Apnea\Hypopnea Index (AHI >=3% desaturation and/or arousal) of 27.4 events per hour.  There were 82 hypopneas (>=4% desaturation) resulting in an Apnea\Hypopnea Index (AHI >=4% desaturation) of 19.4 events per hour.  There were 7 Respiratory Effort Related Arousals resulting in a RERA index of 1.2 events per hour. The Respiratory Disturbance Index is 28.6 events per hour.  The snore index was 580.3 events per hour.  Mean oxygen saturation was 92.8%.  The lowest oxygen saturation during sleep was  81.0%.  Time spent <=88% oxygen saturation was 7.2 minutes (2.0%).  End Tidal CO2 during sleep ranged from - to - mmHg. End Tidal CO2 was greater than 50 mmHg for - minutes and greater than 55 mmHg for - minutes.  Limb Activity There were 3 total limb movements recorded, of this total, - were classified as PLMs.  PLM index was - per hour and PLM associated with Arousals index was - per hour.  Cardiac Summary The average pulse rate was 81.4 bpm.  The minimum pulse rate was 64.0 bpm while the maximum pulse rate was 95.0 bpm.  Cardiac rhythm was normal.  Comment: Moderate obstructive sleep apnea, AHI (4%) 19.4/hr. Snoring with oxygen desaturation to a nadir of 81%, mean 92.8%.  Diagnosis: Obstructive sleep apnea  Recommendations: Suggest CPAP titration sleep study or autopap. Other options would be based on clinical judgment.   This study was personally reviewed and electronically signed by: [employer], MD Accredited Board Certified in Sleep Medicine Date/Time: 10/28/23   11: 47        Diagnostic PSG Report  Patient Name: Melody Wells Study Date: 10/21/2023  Date of Birth: 07/19/1980 Study Type: Diagnostic  Age: 43 year MRN #: [id number]  Sex: Female Interpreting Physician: [employer] [id number]  Height: 5\' 4"  Referring Physician: Beaulah Bouquet, NP  Weight: 228.0 lbs Recording Tech: Holly Neeriemer RPSGT RST  BMI: 39.4 Scoring Tech: Holly Neeriemer RPSGT RST  ESS: 11 Neck Size: 16   Study Overview  Lights Off: 11:15:31 PM  Count Index  Lights On: 05:22:10 AM Awakenings: 8 1.4  Time in Bed: 366.7 min. Arousals:  61 10.6  Total Sleep Time: 346.5 min. AHI (>=3% Desat and/or Ar.): 158 27.4   Sleep Efficiency: 94.5% AHI (>=4% Desat): 112 19.4   Sleep Latency: 2.5 min. Limb Movements: 3 0.5  Wake After Sleep Onset: 17.5 min. Snore: 3351 580.3  REM Latency from Sleep Onset: 67.5 min. Desaturations: 159 27.5     Minimum SpO2 TST: 81.0%    Sleep Architecture  % of Time  in Bed Stages Time (mins) % Sleep Time  Wake 20.5   Stage N1 6.5 1.9%  Stage N2 190.0 54.8%  Stage N3 63.0 18.2%  REM 87.0 25.1%   Arousal Summary   NREM REM Sleep Index  Respiratory Arousals 14 10 24  4.2  PLM Arousals - - - -  Isolated Limb Movement Arousals 3 - 3 0.5  Snore Arousals 7 2 9  1.6  Spontaneous Arousals 18 7 25  4.3  Total 42 19 61 10.6   Limb Movement Summary   Count Index  Isolated Limb Movements 3 0.5  Periodic Limb Movements (PLMs) - -  Total Limb Movements 3 0.5    Respiratory Summary   By Sleep Stage By Body Position Total   NREM REM Supine Non-Supine   Time (min) 259.5 87.0 75.0 271.5 346.5         Obstructive Apnea 4 26 18 12 30   Mixed Apnea - - - - -  Central Apnea - - - - -  Total Apneas 4 26 18 12 30   Total Apnea Index 0.9 17.9 14.4 2.7 5.2         Hypopneas (>=3% Desat and/or Ar.) 52 76 32 96 128  AHI (>=3% Desat and/or Ar.) 12.9 70.3 40.0 23.9 27.4         Hypopneas (>=4% Desat) 22 60 23 59 82  AHI (>=4% Desat) 6.0 59.3 32.8 15.7 19.4          RERAs 7 - 1 6 7   RERA Index 1.6 - 0.8 1.3 1.2         RDI 14.6 70.3 40.8 25.2 28.6    Respiratory Event Type Index  Central Apneas -  Obstructive Apneas 5.2  Mixed Apneas -  Central Hypopneas -  Obstructive Hypopneas 22.3  Central Apnea + Hypopnea (CAHI) -  Obstructive Apnea + Hypopnea (OAHI) 27.5   Respiratory Event Durations   Apnea Hypopnea   NREM REM NREM REM  Average (seconds) 13.0 13.5 19.2 19.5  Maximum (seconds) 16.4 19.7 38.5 32.8    Oxygen Saturation Summary   Wake NREM REM TST TIB  Average SpO2 (%) 93.9% 92.7% 92.9% 92.7% 92.8%  Minimum SpO2 (%) 91.0% 83.0% 81.0% 81.0% 81.0%  Maximum SpO2 (%) 99.0% 100.0% 100.0% 100.0% 100.0%   Oxygen Saturation Distribution  Range (%) Time in range (min) Time in range (%)  90.0 - 100.0 333.0 92.1%  80.0 - 90.0 28.5 7.9%  70.0 - 80.0 - -  60.0 - 70.0 - -  50.0 - 60.0 - -  0.0 - 50.0 - -  Time Spent <=88% SpO2  Range (%) Time  in range (min) Time in range (%)  0.0 - 88.0 7.2 2.0%      Count Index  Desaturations 159 27.5    Cardiac Summary   Wake NREM REM Sleep Total  Average Pulse Rate (BPM) 82.0 82.4 78.4 81.4 81.4  Minimum Pulse Rate (BPM) 72.0 64.0 64.0 64.0 64.0  Maximum Pulse Rate (BPM) 95.0 93.0 91.0 93.0 95.0   Pulse Rate Distribution:  Range (bpm)  Time in range (min) Time in range (%)  0.0 - 40.0 - -  40.0 - 60.0 - -  60.0 - 80.0 119.5 33.0%  80.0 - 100.0 242.5 67.0%  100.0 - 120.0 - -  120.0 - 140.0 - -  140.0 - 200.0 - -   EtCO2 Summary  Stage Min (mmHg) Average (mmHg) Max (mmHg)  Wake - - -  NREM(1+2+3) - - -  REM - - -   EtCO2 Distribution:  Range (mmHg) Time in range (min) Time in range (%)  20.0 - 40.0 - -  40.0 - 50.0 - -  50.0 - 100.0 - -  55.0 - 100.0 - -  Excluded data <20.0 & >65.0 367.0 100.0%     Hypnograms                         Technologist Comments  The 43 year old female patient was seen in the [facility] for a baseline NPSG study for snoring and OSA. The patient was explained the study process, wire application and patient voiced understanding.    The patient was observed sleeping supine, right and left lateral.  Mild to loud snoring was observed throughout the night.  Patient demonstrated apneic and hypopneic events with SpO2 desaturations into the 80's.  Events appeared to be more frequent and worse during stage REM sleep. PLMs/PLMS were rare during the study.  ECG: Appeared to be NSR, no obvious or frequent arrhythmias.  Restroom visits: 1 No obvious parasomnias were noted during the recording.  Room Air entire time in [facility]. The patient did not self administer any medications during her stay in the [facility].  -TECH                        Avion Patella Garret Kales, American Board of Sleep Medicine  ELECTRONICALLY SIGNED ON:  10/28/2023, 11:47 AM [facility] PH: [phone]   FX: [phone]  [phone] ACCREDITED BY THE AMERICAN ACADEMY OF SLEEP MEDICINE

## 2024-01-29 NOTE — Procedures (Signed)
 Per appt notes: needs repeat due to insufficient data.

## 2024-01-29 NOTE — Procedures (Signed)
 Orders only

## 2024-04-27 IMAGING — CT CT HEAD W/O CM
3 series · 15 of 47 positions shown, 18 images · non-contrast
Comparison: None Available.

CLINICAL DATA: Altered mental status, nontraumatic



[Series 3: head 5.0 h30s · axial · 0.42mm/px · z∈[-134,-4]mm · 9 of 32 slices shown, 12 images]
[im 3/32  brain]
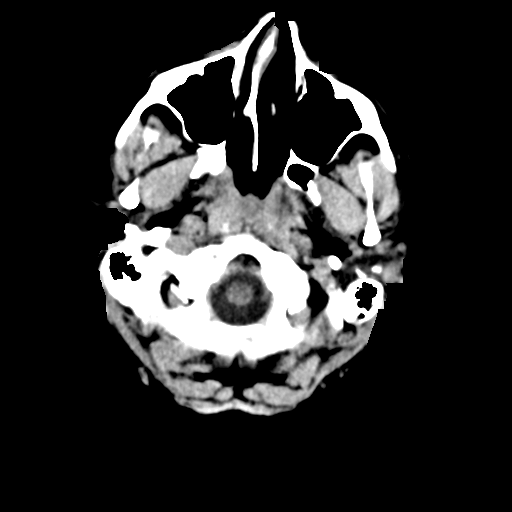
[im 3/32  bone]
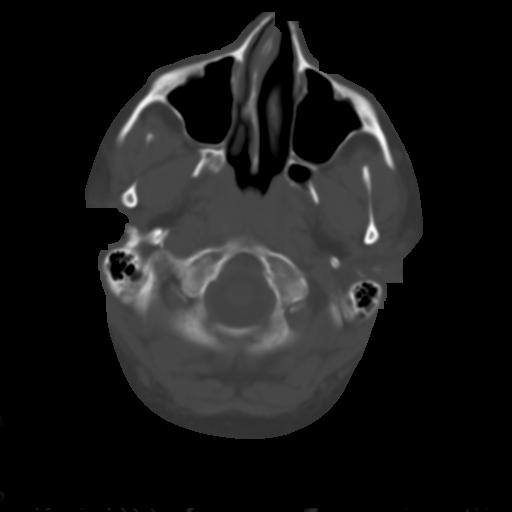
[im 6/32  brain]
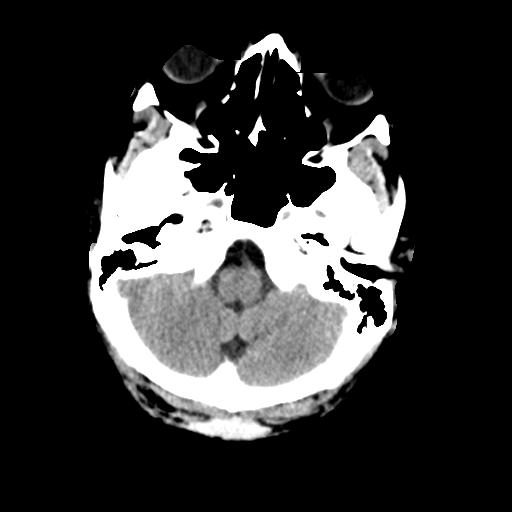
[im 9/32  brain]
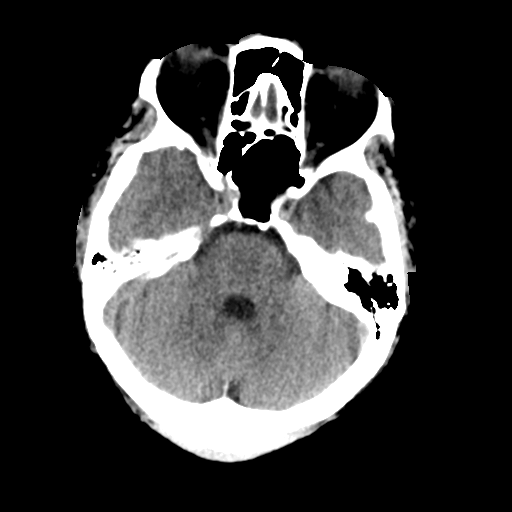
[im 12/32  brain]
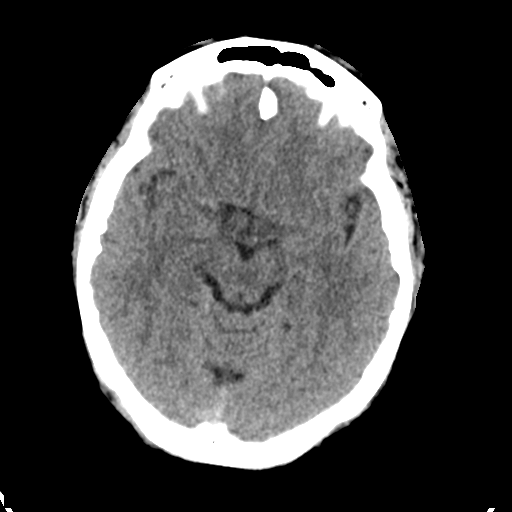
[im 17/32  brain]
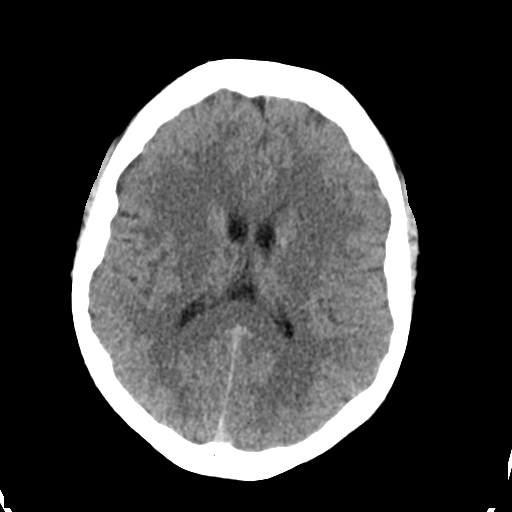
[im 17/32  bone]
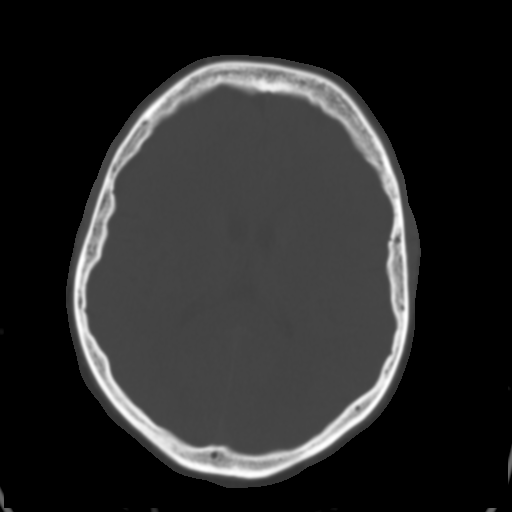
[im 20/32  brain]
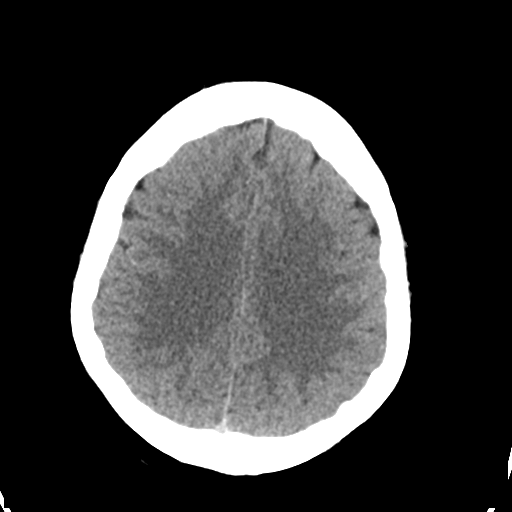
[im 23/32  brain]
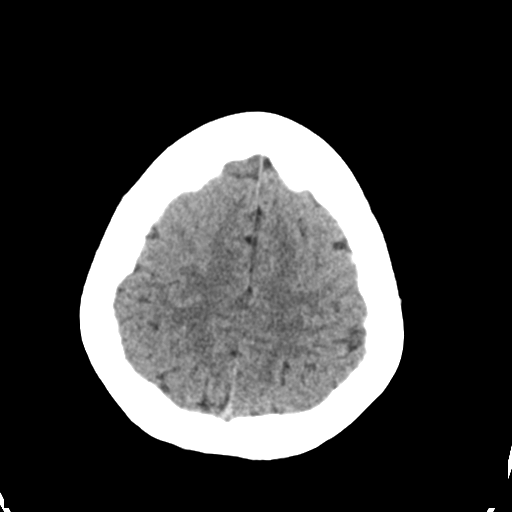
[im 26/32  brain]
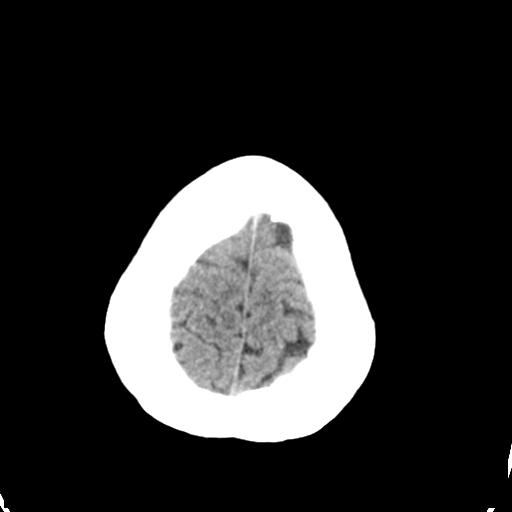
[im 29/32  brain]
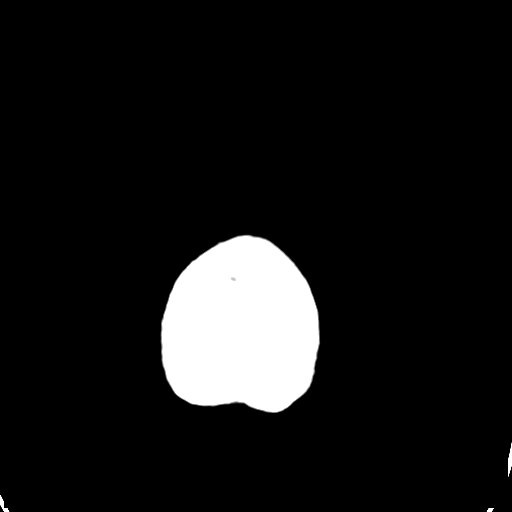
[im 29/32  bone]
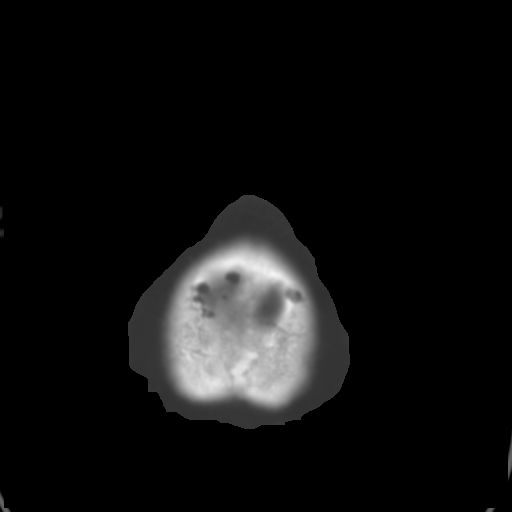

[Series 5: head 3.0 mpr cor · coronal · 0.31mm/px · 3 of 67 slices shown]
[im 23/67  brain]
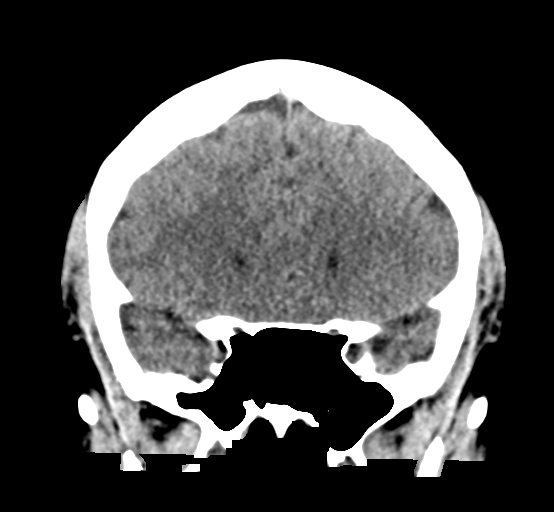
[im 30/67  brain]
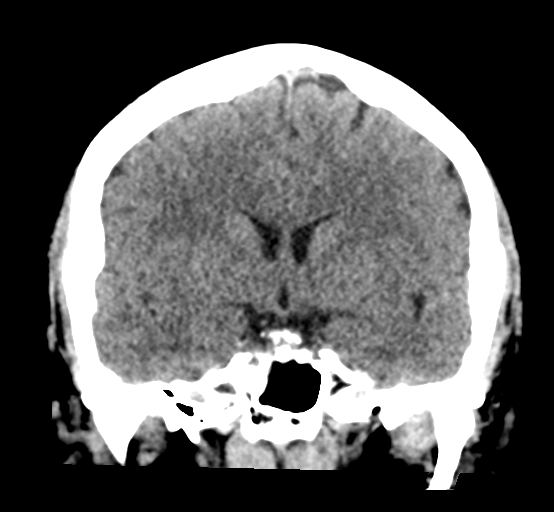
[im 37/67  brain]
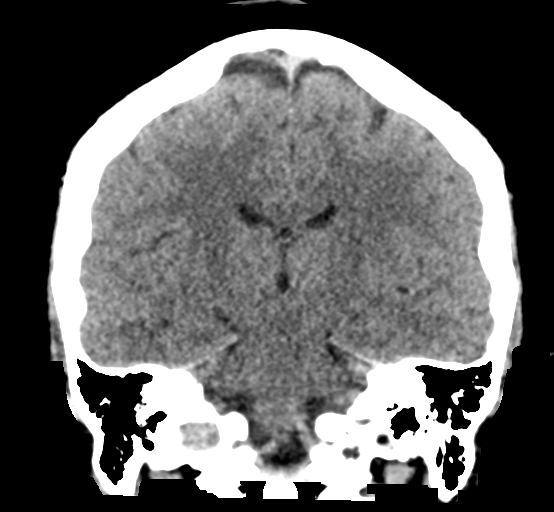

[Series 6: head 3.0 mpr sag · sagittal · 0.31mm/px · 3 of 59 slices shown]
[im 20/59  brain]
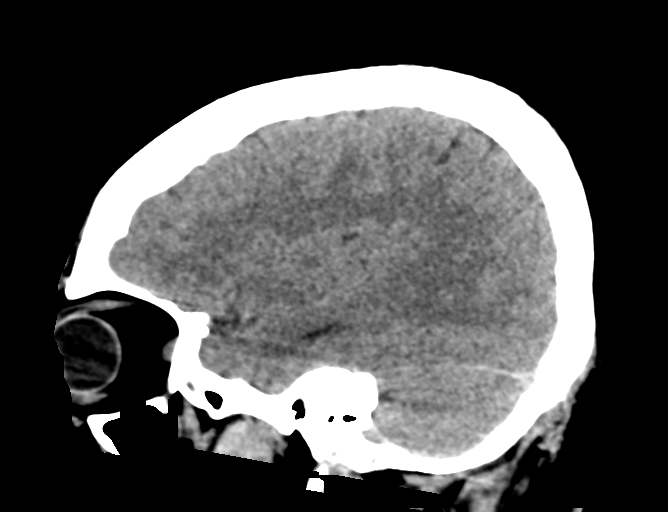
[im 30/59  brain]
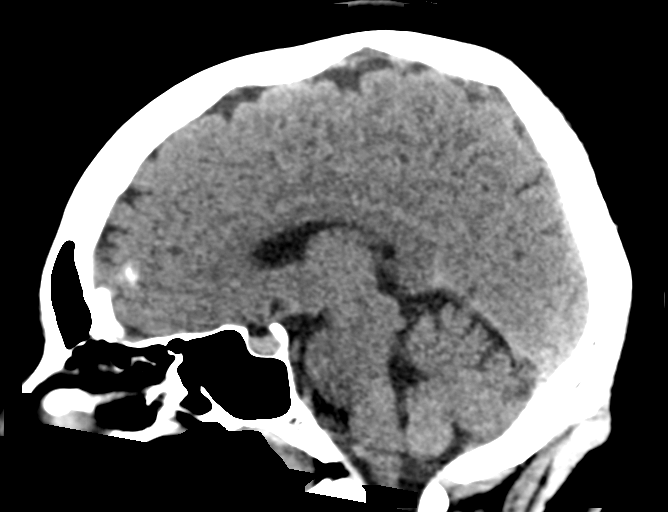
[im 39/59  brain]
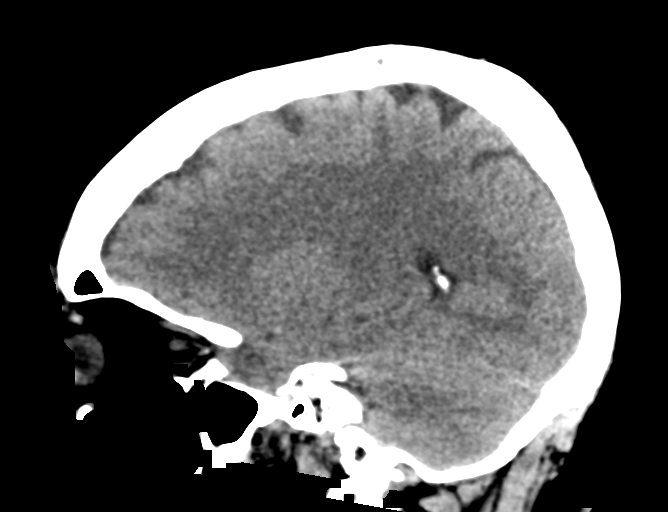

[15 of 47 positions shown; findings below may reference images not displayed]

FINDINGS: Brain: No evidence of acute infarction, hemorrhage, hydrocephalus,
extra-axial collection or mass lesion/mass effect.

Vascular: No hyperdense vessel or unexpected calcification.

Skull: Normal. Negative for fracture or focal lesion.

Sinuses/Orbits: Retention cyst in the right maxillary antrum. No
acute air-fluid levels. Mastoid air cells are clear.

Other: None.
IMPRESSION: No acute intracranial abnormalities.
